# Patient Record
Sex: Female | Born: 1950 | Race: White | Hispanic: Yes | Marital: Single | Smoking: Never smoker
Health system: Southern US, Community
[De-identification: ages and names within clinical notes are randomized; demographics above are authoritative.]

## PROBLEM LIST (undated history)

## (undated) DIAGNOSIS — B9681 Helicobacter pylori [H. pylori] as the cause of diseases classified elsewhere: Secondary | ICD-10-CM

## (undated) DIAGNOSIS — K5730 Diverticulosis of large intestine without perforation or abscess without bleeding: Secondary | ICD-10-CM

## (undated) DIAGNOSIS — I10 Essential (primary) hypertension: Secondary | ICD-10-CM

## (undated) DIAGNOSIS — K2970 Gastritis, unspecified, without bleeding: Secondary | ICD-10-CM

## (undated) DIAGNOSIS — J45909 Unspecified asthma, uncomplicated: Secondary | ICD-10-CM

## (undated) DIAGNOSIS — K219 Gastro-esophageal reflux disease without esophagitis: Secondary | ICD-10-CM

## (undated) DIAGNOSIS — E785 Hyperlipidemia, unspecified: Secondary | ICD-10-CM

## (undated) DIAGNOSIS — Z86010 Personal history of colonic polyps: Principal | ICD-10-CM

## (undated) DIAGNOSIS — H409 Unspecified glaucoma: Secondary | ICD-10-CM

## (undated) DIAGNOSIS — T7840XA Allergy, unspecified, initial encounter: Secondary | ICD-10-CM

## (undated) DIAGNOSIS — E669 Obesity, unspecified: Secondary | ICD-10-CM

## (undated) HISTORY — DX: Gastritis, unspecified, without bleeding: K29.70

## (undated) HISTORY — DX: Hyperlipidemia, unspecified: E78.5

## (undated) HISTORY — DX: Unspecified glaucoma: H40.9

## (undated) HISTORY — PX: COLONOSCOPY: SHX174

## (undated) HISTORY — DX: Allergy, unspecified, initial encounter: T78.40XA

## (undated) HISTORY — DX: Diverticulosis of large intestine without perforation or abscess without bleeding: K57.30

## (undated) HISTORY — DX: Obesity, unspecified: E66.9

## (undated) HISTORY — DX: Essential (primary) hypertension: I10

## (undated) HISTORY — DX: Gastro-esophageal reflux disease without esophagitis: K21.9

## (undated) HISTORY — DX: Helicobacter pylori (H. pylori) as the cause of diseases classified elsewhere: B96.81

## (undated) HISTORY — DX: Unspecified asthma, uncomplicated: J45.909

## (undated) HISTORY — DX: Personal history of colonic polyps: Z86.010

## (undated) HISTORY — PX: CATARACT EXTRACTION: SUR2

## (undated) HISTORY — PX: POLYPECTOMY: SHX149

---

## 2003-12-29 ENCOUNTER — Emergency Department (HOSPITAL_COMMUNITY): Admission: EM | Admit: 2003-12-29 | Discharge: 2003-12-29 | Payer: Self-pay | Admitting: Emergency Medicine

## 2004-04-02 ENCOUNTER — Other Ambulatory Visit: Admission: RE | Admit: 2004-04-02 | Discharge: 2004-04-02 | Payer: Self-pay | Admitting: Obstetrics and Gynecology

## 2005-08-07 ENCOUNTER — Encounter (INDEPENDENT_AMBULATORY_CARE_PROVIDER_SITE_OTHER): Payer: Self-pay | Admitting: *Deleted

## 2005-08-07 ENCOUNTER — Ambulatory Visit: Payer: Self-pay | Admitting: Family Medicine

## 2005-11-04 ENCOUNTER — Ambulatory Visit (HOSPITAL_COMMUNITY): Admission: RE | Admit: 2005-11-04 | Discharge: 2005-11-04 | Payer: Self-pay | Admitting: *Deleted

## 2005-12-15 ENCOUNTER — Emergency Department (HOSPITAL_COMMUNITY): Admission: EM | Admit: 2005-12-15 | Discharge: 2005-12-15 | Payer: Self-pay | Admitting: Emergency Medicine

## 2005-12-16 ENCOUNTER — Ambulatory Visit: Payer: Self-pay | Admitting: Family Medicine

## 2005-12-25 ENCOUNTER — Ambulatory Visit (HOSPITAL_COMMUNITY): Admission: RE | Admit: 2005-12-25 | Discharge: 2005-12-25 | Payer: Self-pay | Admitting: Obstetrics & Gynecology

## 2006-01-14 ENCOUNTER — Ambulatory Visit: Payer: Self-pay | Admitting: Family Medicine

## 2006-02-10 ENCOUNTER — Ambulatory Visit: Payer: Self-pay | Admitting: Family Medicine

## 2006-02-17 ENCOUNTER — Ambulatory Visit: Payer: Self-pay | Admitting: Family Medicine

## 2006-03-04 ENCOUNTER — Ambulatory Visit: Payer: Self-pay | Admitting: Internal Medicine

## 2006-03-19 ENCOUNTER — Ambulatory Visit: Payer: Self-pay | Admitting: Family Medicine

## 2006-04-08 ENCOUNTER — Ambulatory Visit: Payer: Self-pay | Admitting: Internal Medicine

## 2006-07-22 ENCOUNTER — Encounter: Payer: Self-pay | Admitting: Obstetrics and Gynecology

## 2006-07-22 ENCOUNTER — Ambulatory Visit: Payer: Self-pay | Admitting: Obstetrics and Gynecology

## 2006-09-27 ENCOUNTER — Encounter (INDEPENDENT_AMBULATORY_CARE_PROVIDER_SITE_OTHER): Payer: Self-pay | Admitting: Specialist

## 2006-09-27 ENCOUNTER — Ambulatory Visit (HOSPITAL_COMMUNITY): Admission: RE | Admit: 2006-09-27 | Discharge: 2006-09-28 | Payer: Self-pay | Admitting: Cardiology

## 2006-09-27 ENCOUNTER — Ambulatory Visit: Payer: Self-pay | Admitting: Obstetrics and Gynecology

## 2006-09-27 HISTORY — PX: VAGINAL HYSTERECTOMY: SUR661

## 2006-10-20 ENCOUNTER — Ambulatory Visit: Payer: Self-pay | Admitting: Obstetrics & Gynecology

## 2006-11-18 ENCOUNTER — Ambulatory Visit: Payer: Self-pay | Admitting: Obstetrics and Gynecology

## 2006-11-18 ENCOUNTER — Ambulatory Visit (HOSPITAL_COMMUNITY): Admission: RE | Admit: 2006-11-18 | Discharge: 2006-11-18 | Payer: Self-pay | Admitting: Gynecology

## 2007-03-23 ENCOUNTER — Ambulatory Visit: Payer: Self-pay | Admitting: Family Medicine

## 2007-05-24 ENCOUNTER — Ambulatory Visit: Payer: Self-pay | Admitting: Family Medicine

## 2007-06-02 ENCOUNTER — Ambulatory Visit: Payer: Self-pay | Admitting: Family Medicine

## 2007-06-22 ENCOUNTER — Ambulatory Visit: Payer: Self-pay | Admitting: Gynecology

## 2007-11-18 ENCOUNTER — Ambulatory Visit (HOSPITAL_COMMUNITY): Admission: RE | Admit: 2007-11-18 | Discharge: 2007-11-18 | Payer: Self-pay | Admitting: Family Medicine

## 2007-12-30 ENCOUNTER — Ambulatory Visit: Payer: Self-pay | Admitting: Family Medicine

## 2008-04-12 ENCOUNTER — Ambulatory Visit: Payer: Self-pay | Admitting: Family Medicine

## 2008-04-23 ENCOUNTER — Ambulatory Visit: Payer: Self-pay | Admitting: Family Medicine

## 2008-05-28 ENCOUNTER — Ambulatory Visit: Payer: Self-pay | Admitting: Family Medicine

## 2008-07-30 ENCOUNTER — Ambulatory Visit: Payer: Self-pay | Admitting: Family Medicine

## 2008-12-13 ENCOUNTER — Encounter: Admission: RE | Admit: 2008-12-13 | Discharge: 2008-12-13 | Payer: Self-pay | Admitting: Family Medicine

## 2008-12-18 ENCOUNTER — Ambulatory Visit (HOSPITAL_COMMUNITY): Admission: RE | Admit: 2008-12-18 | Discharge: 2008-12-18 | Payer: Self-pay | Admitting: Family Medicine

## 2009-01-01 ENCOUNTER — Ambulatory Visit: Payer: Self-pay | Admitting: Family Medicine

## 2009-01-08 ENCOUNTER — Ambulatory Visit (HOSPITAL_COMMUNITY): Admission: RE | Admit: 2009-01-08 | Discharge: 2009-01-08 | Payer: Self-pay | Admitting: Family Medicine

## 2009-01-11 ENCOUNTER — Ambulatory Visit: Payer: Self-pay | Admitting: Family Medicine

## 2009-01-15 ENCOUNTER — Encounter: Payer: Self-pay | Admitting: *Deleted

## 2009-01-31 ENCOUNTER — Encounter: Payer: Self-pay | Admitting: *Deleted

## 2009-02-13 ENCOUNTER — Ambulatory Visit: Payer: Self-pay | Admitting: Family Medicine

## 2009-04-18 ENCOUNTER — Ambulatory Visit: Payer: Self-pay | Admitting: Family Medicine

## 2009-05-22 ENCOUNTER — Ambulatory Visit: Payer: Self-pay | Admitting: Family Medicine

## 2009-05-27 ENCOUNTER — Emergency Department (HOSPITAL_COMMUNITY): Admission: EM | Admit: 2009-05-27 | Discharge: 2009-05-27 | Payer: Self-pay | Admitting: Emergency Medicine

## 2009-05-28 ENCOUNTER — Ambulatory Visit: Payer: Self-pay | Admitting: Family Medicine

## 2009-06-07 ENCOUNTER — Ambulatory Visit: Payer: Self-pay | Admitting: Family Medicine

## 2009-06-14 ENCOUNTER — Ambulatory Visit: Payer: Self-pay | Admitting: Family Medicine

## 2009-09-12 ENCOUNTER — Ambulatory Visit: Payer: Self-pay | Admitting: Family Medicine

## 2009-09-13 ENCOUNTER — Ambulatory Visit: Payer: Self-pay | Admitting: Family Medicine

## 2009-09-13 ENCOUNTER — Encounter: Admission: RE | Admit: 2009-09-13 | Discharge: 2009-09-13 | Payer: Self-pay | Admitting: Family Medicine

## 2009-10-24 ENCOUNTER — Ambulatory Visit: Payer: Self-pay | Admitting: Family Medicine

## 2009-12-20 ENCOUNTER — Ambulatory Visit (HOSPITAL_COMMUNITY): Admission: RE | Admit: 2009-12-20 | Discharge: 2009-12-20 | Payer: Self-pay | Admitting: Family Medicine

## 2010-01-01 ENCOUNTER — Ambulatory Visit: Payer: Self-pay | Admitting: Obstetrics & Gynecology

## 2010-01-06 ENCOUNTER — Ambulatory Visit: Payer: Self-pay | Admitting: Family Medicine

## 2010-04-10 ENCOUNTER — Ambulatory Visit: Payer: Self-pay | Admitting: Family Medicine

## 2010-07-24 ENCOUNTER — Ambulatory Visit: Payer: Self-pay | Admitting: Family Medicine

## 2010-09-27 ENCOUNTER — Encounter: Payer: Self-pay | Admitting: *Deleted

## 2010-09-28 ENCOUNTER — Encounter: Payer: Self-pay | Admitting: Family Medicine
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
## 2010-11-26 ENCOUNTER — Other Ambulatory Visit: Payer: Self-pay | Admitting: Family Medicine

## 2010-11-26 DIAGNOSIS — Z1231 Encounter for screening mammogram for malignant neoplasm of breast: Secondary | ICD-10-CM

## 2010-12-22 ENCOUNTER — Ambulatory Visit (HOSPITAL_COMMUNITY)
Admission: RE | Admit: 2010-12-22 | Discharge: 2010-12-22 | Disposition: A | Discharge status: Home / Self Care | Payer: Commercial Managed Care - PPO | Source: Ambulatory Visit | Attending: Family Medicine | Admitting: Family Medicine

## 2010-12-22 DIAGNOSIS — Z1231 Encounter for screening mammogram for malignant neoplasm of breast: Secondary | ICD-10-CM

## 2010-12-25 ENCOUNTER — Encounter (INDEPENDENT_AMBULATORY_CARE_PROVIDER_SITE_OTHER): Payer: Commercial Managed Care - PPO | Admitting: Family Medicine

## 2010-12-25 DIAGNOSIS — E119 Type 2 diabetes mellitus without complications: Secondary | ICD-10-CM

## 2010-12-25 DIAGNOSIS — R110 Nausea: Secondary | ICD-10-CM

## 2010-12-25 DIAGNOSIS — I10 Essential (primary) hypertension: Secondary | ICD-10-CM

## 2010-12-25 DIAGNOSIS — E785 Hyperlipidemia, unspecified: Secondary | ICD-10-CM

## 2010-12-25 DIAGNOSIS — Z79899 Other long term (current) drug therapy: Secondary | ICD-10-CM

## 2011-01-08 ENCOUNTER — Ambulatory Visit (INDEPENDENT_AMBULATORY_CARE_PROVIDER_SITE_OTHER): Payer: Commercial Managed Care - PPO | Admitting: Family Medicine

## 2011-01-08 DIAGNOSIS — M549 Dorsalgia, unspecified: Secondary | ICD-10-CM

## 2011-01-23 NOTE — Group Therapy Note (Signed)
[name], [name] NO.:  [id number]   MEDICAL RECORD NO.:  [id number]          PATIENT TYPE:  WOC   LOCATION:  [facility]                   FACILITY:  [facility]   PHYSICIAN:  Argentina Donovan, MD        DATE OF BIRTH:  01-23-1951   DATE OF SERVICE:  07/22/2006                                    CLINIC NOTE   The patient is a 60 year old gravida 4, para 4-0-0-4 all vaginal deliveries,  the youngest child at 77 years old.  For the last 10 years has had pelvic  pain on and off, mainly with her period and significant enough that she  wants something done.  Ultrasound showed a uterus that was slightly enlarged  probably adenomyosis with a normal endometrial stripe.  Her periods have  gradually gotten heavier, and she is not menopausal as yet but wants  something done.  We have discussed the procedure with her and talked about  vaginal hysterectomy with possibility of abdominal, and she agrees to go  through with the surgery.  She is 5 feet five inches, weighs 179 pounds, has  a blood pressure of 133/90 and is on some blood pressure medication which  she did not know the name of today.  Other than that, she is in good  physical health with no other evidence of comorbidity.  The Pap smear she  gets annually, and they have always been normal.  Her abdomen is soft, flat,  nontender in the area where she states this pain even on deep palpation  without guarding or rebound.  External genitalia is normal.  BUN is within  normal limits.  Vagina is clean and well rugated.  Cervix clean and parous.  A Pap smear was taken.  The uterus is about the size of an 8 week pregnancy,  soft in configuration, anterior, and free, movable with a normal free cul-de-  sac.  The adnexa could not be well outlined because of the habitus of the  patient and the size of the uterus.  __________  negative no edema, no  varices.  Deep tendon reflexes were normal.   IMPRESSION:  Chronic pelvic pain with  menorrhagia, probably adenomyosis.  We  will schedule the patient for vaginal hysterectomy.           ______________________________  Argentina Donovan, MD     PR/MEDQ  D:  07/22/2006  T:  07/22/2006  Job:  [id number]

## 2011-01-23 NOTE — Group Therapy Note (Signed)
[name], [name]                  ACCOUNT NO.:  [id number]   MEDICAL RECORD NO.:  [id number]          PATIENT TYPE:  WOC   LOCATION:  [facility]                   FACILITY:  [facility]   PHYSICIAN:  Tinnie Gens, MD        DATE OF BIRTH:  13-Nov-1950   DATE OF SERVICE:                                    CLINIC NOTE   CHIEF COMPLAINT:  A four week follow up.   HISTORY OF PRESENT ILLNESS:  The patient is a 60 year old Hispanic female  who recently had a regular cycle but came in after heavy period in February.  At that time, an attempt was made to do endometrial sampling.  However, it  could not be done because of a very scarred cervix.  She did not have any  bleeds since that time.  She was sent for ultrasound which showed a uterus  that was enlarged probably with adenomyosis and a normal endometrial stripe  over 25 mm.  On exam, her labs were in the chart.   PHYSICAL EXAMINATION:  GENERAL APPEARANCE:  Well-developed, well-nourished,  sitting up in no acute distress.  ABDOMEN:  Soft, nontender, nondistended.   IMPRESSION:  Abnormal uterine bleeding x1, likely perimenopausal, unlikely  the risk of endometrial carcinoma secondary to __________endometrial stripe.  However, should heavy bleeding begin again, would try to do sampling.  Have  advised patient to come in with her next bleeding episode, otherwise, she  will follow up in December for repeat Pap smear.           ______________________________  Tinnie Gens, MD     TP/MEDQ  D:  01/14/2006  T:  01/15/2006  Job:  [id number]

## 2011-01-23 NOTE — Group Therapy Note (Signed)
[name], [name]                  ACCOUNT NO.:  [id number]   MEDICAL RECORD NO.:  [id number]          PATIENT TYPE:  WOC   LOCATION:  [facility]                   FACILITY:  [facility]   PHYSICIAN:  Tinnie Gens, MD        DATE OF BIRTH:  1950-09-13   DATE OF SERVICE:  08/07/2005                                    CLINIC NOTE   CHIEF COMPLAINT:  Annual examination.   HISTORY OF PRESENT ILLNESS:  Patient is a 60 year old gravida 4, para 2 who  comes in today for an annual examination.  It has been approximately 18  months since her last Pap smear and mammography.  Today she is without  significant complaint.   PAST MEDICAL HISTORY:  She does have a history of elevated blood pressure  approximately three years ago but she lost weight and said it was better  after that.   PAST SURGICAL HISTORY:  Negative.   MEDICATIONS:  None.   ALLERGIES:  None known.   Cycles have begun to slow down.  She is having one approximately every two  to three months.  She does have some hot flashes occasionally.  She does  have a history of abnormal Pap smear.  She is status post cryo in 1996.  She  had a normal mammogram in 2005.   FAMILY HISTORY:  Diabetes and high blood pressure in her mother.   SOCIAL HISTORY:  No tobacco, alcohol, or drug use.   14-point review of systems is reviewed.  Please see GYN history in the  chart, but is essentially negative.   PHYSICAL EXAMINATION:  VITAL SIGNS:  Blood pressure 152/99, weight is 172.9.  GENERAL:  She is a well-developed, well-nourished Hispanic female in no  acute distress.  NECK:  Supple with normal thyroid.  BREASTS:  Symmetric with everted nipples.  She has some fibrocystic change  at approximately 6 o'clock bilaterally, but no discrete masses.  LYMPH:  There is no supraclavicular or axillary adenopathy.  ABDOMEN:  Soft and flat.  There is no organomegaly.  EXTREMITIES:  No clubbing, cyanosis, edema.  Pulses are 2+ bilaterally.  GENITOURINARY:  She  has some atrophic external and vaginal changes noted.  The vagina is pale and has lost rugation.  Cervix has a scar noted from old  cryo, but no lesions.  The transition zone is well up in the endocervical  canal.  The uterus is approximately 6-8 weeks size and firm and globular.  There is no adnexal mass and she is mildly tender in the right adnexa.   IMPRESSION:  GYN examination.   PLAN:  1.  Pap smear today.  2.  Schedule mammography.  3.  Patient is given the number for [facility] for repeat blood pressure      check and encouraged to follow up on this.           ______________________________  Tinnie Gens, MD     TP/MEDQ  D:  08/07/2005  T:  08/07/2005  Job:  [id number]

## 2011-01-23 NOTE — H&P (Signed)
[name], [name]                  ACCOUNT NO.:  [id number]   MEDICAL RECORD NO.:  [id number]          PATIENT TYPE:  AMB   LOCATION:  [facility]                           [facility]   PHYSICIAN:  Phil D. Okey Dupre, M.D.     DATE OF BIRTH:  01/11/1951   DATE OF ADMISSION:  09/27/2006  DATE OF DISCHARGE:                              HISTORY & PHYSICAL   CHIEF COMPLAINT:  Heavy periods with severe pain.   HISTORY OF PRESENT ILLNESS:  The patient is a 60 year old gravida 4,  para 4-0-0-4, all vaginal deliveries, the youngest child age 36.  For  the last 10 years has had pelvic pain on and off and with her periods  significant enough that wants something done.  Ultrasound has shown the  uterus that is slightly enlarged, probably adenomyosis, with a normal  endometrial stripe.  An endometrial biopsy was not possible because of  the stenosis of the cervix, which was tried back in 2006.  Her periods  have gradually gotten heavier and the pain has gotten worse.  We have  discussed the procedure with having the patient go through a vaginal  hysterectomy with the possibility of abdominal hysterectomy, and she  desires this surgery.  We have talked to her and told her that probably  if we waited a short time, all her periods would stop and, if that was  the case, the pain, if it is due to adenomyosis, would go away also.  She does not want to wait.  She says she has suffered long enough.  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   She is generally in good health with the exception of blood pressure  problems, for which she takes blood pressure medication daily.  She is  going to bring in the bottle so we know what she is taking, but she did  not know the name of it when we talked to her today.  She has a Pap  smear annually.  They have always been normal.  She also complains of  dyspareunia on deep penetration.  We had a long discussion.  I told  the  patient that if she would wait, there were certainly not going to be any  surgical complications but that there were possible complications with  the surgery that entailed those that could mean anesthetic complications  and even death.  We talked about injury to bowel, urinary tract,  hemorrhage, postoperative infection, and she still at the end of our  discussion said she wanted to undergo the surgery.   PAST MEDICAL HISTORY:  She has a history of elevated blood pressure and  is still on medication.   PAST SURGICAL HISTORY:  None.   MEDICATIONS:  She takes blood pressure medication and she takes Tylenol  for her pain.   ALLERGIES:  She has no known medical allergies.   FAMILY HISTORY:  Her mother has diabetes and high blood pressure.   SOCIAL HISTORY:  No tobacco, alcohol or  drug use.   A 14-point review was generally negative with the exception of the  present illness.   PHYSICAL EXAMINATION:  VITAL SIGNS:  The patient is 5 feet 5 inches  tall, weighs 179 pounds.  The blood pressure is 133/90.  Her pulse is  68.  Respirations 18 per minute.  Temperature is 98.9.  GENERAL:  She is a well-developed, well-nourished Hispanic female who  speaks English, in no acute distress.  NECK:  Supple.  Thyroid is normal.  HEENT:  PERRLA, within normal limits.  BACK:  Erect.  BREASTS:  Symmetrical with no nipple discharge and no dominant masses.  ABDOMEN:  Soft, flat, nontender, no masses, no organomegaly palpable.  EXTREMITIES:  No edema.  No varices.  DTRs within normal limits.  GENITOURINARY:  The external genitalia is normal.  BUS within normal  limits with a slight area of apparent atrophy.  The vagina is clean and  well-rugated.  The cervix is clean, parous and stenotic.  The uterus is  about 8-10 weeks' gestational size, freely movable.  The adnexa could  not be palpated.  RECTAL:  There is no dominant mass.   IMPRESSION:  Menorrhagia with severe dysmenorrhea in a  perimenopausal  woman.   PLAN:  Total abdominal hysterectomy, possible bilateral salpingo-  oophorectomy, possible abdominal hysterectomy.           ______________________________  Javier Glazier Okey Dupre, M.D.     PDR/MEDQ  D:  09/21/2006  T:  09/21/2006  Job:  [id number]

## 2011-01-23 NOTE — Op Note (Signed)
[name], [name]                  ACCOUNT NO.:  [id number]   MEDICAL RECORD NO.:  [id number]          PATIENT TYPE:  AMB   LOCATION:  [facility]                           [facility]   PHYSICIAN:  Tanya S. Shawnie Pons, M.D.   DATE OF BIRTH:  [birth year]   DATE OF PROCEDURE:  09/27/2006  DATE OF DISCHARGE:                               OPERATIVE REPORT   PREOPERATIVE DIAGNOSES:  Dysmenorrhea, menorrhagia.   POSTOPERATIVE DIAGNOSES:  Dysmenorrhea, menorrhagia, left paraovarian  cyst.   PROCEDURE:  Transvaginal hysterectomy and left paraovarian cystectomy.   SURGEON:  Javier Glazier. Okey Dupre, M.D.   ASSISTANT:  Shelbie Proctor. Shawnie Pons, M.D.   ANESTHESIA:  General and local.   SPECIMENS:  213 gram uterus and __________ .   ESTIMATED BLOOD LOSS:  300 mL.   COMPLICATIONS:  None.   INDICATIONS FOR PROCEDURE:  Briefly, the patient is a 60 year old  gravida 4, para 4 who has severe dysmenorrhea as well as menorrhagia.  She had attempt at endometrial biopsy that could not be done secondary  to the cervical stenosis but had a 5 mm endometrial stripe. She had an  enlarged uterus on ultrasound which suggested the presence of  adenomyosis. The patient desired permanent treatment for this and was  canceled regarding the risks and benefits of this procedure. She agreed  to the treatment offered.   DESCRIPTION OF PROCEDURE:  The patient went to the OR, she was placed in  dorsal lithotomy in [place] stirrups. She was prepped and draped in the  usual sterile fashion. A weighted speculum was placed above the vagina.  The cervix was visualized, grasped with two double-tooth tenaculums and  10 mL of 1% lidocaine with epinephrine was injected circumferentially. A  circumferential incision was made with a knife, this brought entry into  the peritoneal cavity posteriorly. The posterior peritoneum was tacked  to the edge of the vagina. Attempt was made to __________  anteriorly;  however, this could not be done so 2 Haney  clamps were then used to  grasp the uterosacral ligaments which were cut and suture ligatured with  Heaney type suture and tagged. The gyrus was then used to take bits of  the uterus to include the uterines and the broad ligament until the  uterus could be brought out through the incision with __________  pedicles. __________  then inspected, grasped with Heaney clamp and  specimen removed. Free ties plus suture ligature were used about each of  these pedicles and excellent hemostasis was noted. Some bleeding from  the posterior cuff was then noted but the pedicles appeared dry. A large  __________  approximately 3 cm was noted on the left and this was  removed without difficulty with the gyrus. The vaginal cuff was then  closed horizontally incorporating the uterosacral ligament into the cuff  at the lateral edges. All suture and instruments were removed from the  vagina. The Foley catheter was placed inside the bladder, clear yellow  urine was  noted. The vagina was packed with iodoform gauze. All instrument and lap  counts were correct x2.  The patient was awakened and taken to the  recovery room in stable condition. The specimens in this case are stent  to pathology. The patient went to the recovery room in stable condition.           ______________________________  Shelbie Proctor Shawnie Pons, M.D.     TSP/MEDQ  D:  09/27/2006  T:  09/27/2006  Job:  [id number]

## 2011-01-23 NOTE — Discharge Summary (Signed)
NAMECALISTA, [name]                  ACCOUNT NO.:  [id number]   MEDICAL RECORD NO.:  [id number]          PATIENT TYPE:  OIB   LOCATION:  9319                          [facility]   PHYSICIAN:  Phil D. Okey Dupre, M.D.     DATE OF BIRTH:  [birth year]   DATE OF ADMISSION:  09/27/2006  DATE OF DISCHARGE:  09/28/2006                               DISCHARGE SUMMARY   The patient is a 60 year old black female with chronic menorrhagia and  dysmenorrhea who insisted on surgery although close to menopause.  Underwent total vaginal hysterectomy on the day of admission. Has done  very well overnight. Has had vital signs normal, afebrile course,  extremities normal.   PHYSICAL EXAMINATION AT DISCHARGE:  Her lungs are clear.  NECK:  Is supple.  ABDOMEN:  Soft, flat, not particularly tender. No rebound. No  significant genital discharge.  EXTREMITIES:  Negative.   The patient is voiding well. Will be followed in the office in 2 weeks.  We discussed postoperative activity with her, especially that related to  lifting and stairs. We have told her that the time for recovery is 6  weeks; however, with her job not gauged at heavy lifting, she might be  able to go back in 3 or 4. Discharge hemoglobin was 10.9; admission was  12.2. None of the rest of the CBC had changed significantly since  admission.   DISCHARGE DIAGNOSES:  Status post vaginal hysterectomy for menorrhagia  and dysmenorrhea.           ______________________________  Javier Glazier. Okey Dupre, M.D.     PDR/MEDQ  D:  09/28/2006  T:  09/28/2006  Job:  [id number]

## 2011-01-23 NOTE — Group Therapy Note (Signed)
NAMELYGIA, OLAES                  ACCOUNT NO.:  [id number]   MEDICAL RECORD NO.:  [id number]          PATIENT TYPE:  WOC   [facility]ATION[facility]                   [facility]   PHYSICIAN:  Tinnie Gens, MD        DATE OF BIRTH:  [birth year]   DATE OF SERVICE:  12/16/2005                                    CLINIC NOTE   CHIEF COMPLAINT:  Heavy vaginal bleeding.   HISTORY OF PRESENT ILLNESS:  The patient is a 60 year old Hispanic lady, who  was recently seen here in December of 2006, without complaints, who started  to have irregular cycles at that time, and noted that her last menstrual  period was exceptionally heavy and lasted longer than usual.  She had almost  no period in March.  As previously noted, she has a history of an abnormal  Pap smear and had cryo in 1996.  The patient also complains of some  allergies.  She was seen in the ED for this last p.m. and has gotten  treatment.   PHYSICAL EXAMINATION:  VITAL SIGNS:  As in the chart.  GENERAL:  A well-  developed, well-nourished, Hispanic female, in no acute distress.  ABDOMEN:  Soft, nontender, nondistended.  GU:  She has a small atrophic vagina, pale  in nature.  Cervix is scarred.  I can not find an easy entrance for the os.  An attempt was made for endometrial biopsy, but this could not be obtained  today.  As mentioned previously, the cervix was scarred, related to prior  surgery and no os could be visualized.  The uterus is lobular and  approximately 6-8 weeks size, although good definition was very hard to  discern today.  The patient has right lower quadrant tenderness, but it was  fairly significant and she had guarding __________ , and a good exam was not  able to obtained on that side either.   IMPRESSION:  Right-sided abdominal pain and abnormal uterine bleeding in a  perimenopausal female.   PLAN:  1.  Return with menses for endometrial biopsy sampling.  2.  Pelvic ultrasound, rule out pathology.  Follow up  in four weeks.           ______________________________  Tinnie Gens, MD     TP/MEDQ  D:  12/16/2005  T:  12/17/2005  Job:  [id number]

## 2011-02-25 ENCOUNTER — Encounter: Payer: Self-pay | Admitting: Family Medicine

## 2011-02-25 DIAGNOSIS — E119 Type 2 diabetes mellitus without complications: Secondary | ICD-10-CM | POA: Insufficient documentation

## 2011-02-25 DIAGNOSIS — J309 Allergic rhinitis, unspecified: Secondary | ICD-10-CM | POA: Insufficient documentation

## 2011-02-25 DIAGNOSIS — K219 Gastro-esophageal reflux disease without esophagitis: Secondary | ICD-10-CM | POA: Insufficient documentation

## 2011-02-25 DIAGNOSIS — K5790 Diverticulosis of intestine, part unspecified, without perforation or abscess without bleeding: Secondary | ICD-10-CM | POA: Insufficient documentation

## 2011-02-25 DIAGNOSIS — E669 Obesity, unspecified: Secondary | ICD-10-CM | POA: Insufficient documentation

## 2011-02-25 DIAGNOSIS — I152 Hypertension secondary to endocrine disorders: Secondary | ICD-10-CM | POA: Insufficient documentation

## 2011-02-25 DIAGNOSIS — I10 Essential (primary) hypertension: Secondary | ICD-10-CM

## 2011-02-25 DIAGNOSIS — E1136 Type 2 diabetes mellitus with diabetic cataract: Secondary | ICD-10-CM | POA: Insufficient documentation

## 2011-02-25 DIAGNOSIS — E118 Type 2 diabetes mellitus with unspecified complications: Secondary | ICD-10-CM | POA: Insufficient documentation

## 2011-02-25 DIAGNOSIS — E1165 Type 2 diabetes mellitus with hyperglycemia: Secondary | ICD-10-CM | POA: Insufficient documentation

## 2011-02-25 DIAGNOSIS — E1159 Type 2 diabetes mellitus with other circulatory complications: Secondary | ICD-10-CM | POA: Insufficient documentation

## 2011-03-27 ENCOUNTER — Encounter: Payer: Self-pay | Admitting: Family Medicine

## 2011-03-27 ENCOUNTER — Ambulatory Visit: Payer: Commercial Managed Care - PPO | Admitting: Family Medicine

## 2011-03-30 ENCOUNTER — Ambulatory Visit: Payer: Commercial Managed Care - PPO | Admitting: Family Medicine

## 2011-03-31 ENCOUNTER — Telehealth: Payer: Self-pay

## 2011-03-31 ENCOUNTER — Other Ambulatory Visit: Payer: Self-pay | Admitting: Family Medicine

## 2011-03-31 ENCOUNTER — Encounter: Payer: Self-pay | Admitting: Family Medicine

## 2011-03-31 ENCOUNTER — Ambulatory Visit (INDEPENDENT_AMBULATORY_CARE_PROVIDER_SITE_OTHER): Payer: Commercial Managed Care - PPO | Admitting: Family Medicine

## 2011-03-31 DIAGNOSIS — I10 Essential (primary) hypertension: Secondary | ICD-10-CM

## 2011-03-31 DIAGNOSIS — E119 Type 2 diabetes mellitus without complications: Secondary | ICD-10-CM

## 2011-03-31 DIAGNOSIS — E785 Hyperlipidemia, unspecified: Secondary | ICD-10-CM

## 2011-03-31 DIAGNOSIS — Z79899 Other long term (current) drug therapy: Secondary | ICD-10-CM

## 2011-03-31 DIAGNOSIS — E1169 Type 2 diabetes mellitus with other specified complication: Secondary | ICD-10-CM

## 2011-03-31 DIAGNOSIS — R932 Abnormal findings on diagnostic imaging of liver and biliary tract: Secondary | ICD-10-CM

## 2011-03-31 DIAGNOSIS — E669 Obesity, unspecified: Secondary | ICD-10-CM

## 2011-03-31 DIAGNOSIS — I152 Hypertension secondary to endocrine disorders: Secondary | ICD-10-CM

## 2011-03-31 DIAGNOSIS — R1011 Right upper quadrant pain: Secondary | ICD-10-CM

## 2011-03-31 LAB — CBC WITH DIFFERENTIAL/PLATELET
Basophils Absolute: 0 10*3/uL (ref 0.0–0.1)
Basophils Relative: 1 % (ref 0–1)
HCT: 38.1 % (ref 36.0–46.0)
Monocytes Relative: 8 % (ref 3–12)
Neutro Abs: 2 10*3/uL (ref 1.7–7.7)
Platelets: 290 10*3/uL (ref 150–400)
RBC: 4.41 MIL/uL (ref 3.87–5.11)
RDW: 15.4 % (ref 11.5–15.5)

## 2011-03-31 LAB — COMPREHENSIVE METABOLIC PANEL
AST: 14 U/L (ref 0–37)
BUN: 13 mg/dL (ref 6–23)
CO2: 28 mEq/L (ref 19–32)
Calcium: 9.2 mg/dL (ref 8.4–10.5)
Creat: 0.6 mg/dL (ref 0.50–1.10)
Potassium: 4.3 mEq/L (ref 3.5–5.3)
Sodium: 139 mEq/L (ref 135–145)

## 2011-03-31 LAB — LIPID PANEL
Total CHOL/HDL Ratio: 3.3 Ratio
Triglycerides: 80 mg/dL (ref <150)
VLDL: 16 mg/dL (ref 0–40)

## 2011-03-31 NOTE — Telephone Encounter (Signed)
Left message for pt on both apt 1st is [place] [facility] Thursday 26 @ 8 am nothing to eat or drink after mid nite and 2nd [facility] aug. 7 @ 9:30

## 2011-03-31 NOTE — Patient Instructions (Signed)
Continue on your present medications. Use Lotrimin for the rash under right breast and if no improvement after one month, call me

## 2011-03-31 NOTE — Progress Notes (Signed)
  Subjective:    Patient ID: [name], female    DOB: 1951-05-09, 60 y.o.   MRN: [id number]  HPI She is here for recheck. She does complain of right upper cautery the pain. Her command of the [place] language is limited. She does state that the milk makes the pain worse. Further questioning was difficult. She also complains of a lesion under her right breast. She also has had difficulty with bilateral leg pain when she stands for long period of time. She continues on medications listed in the chart. She does not smoke or drink. She has not had an eye exam recently.   Review of Systems     Objective:   Physical Exam Alert and in no distress. Cardiac exam shows regular rhythm without murmurs or gallops. Lungs are clear to auscultation. Abdominal exam shows active bowel sounds with positive Murphy's sign in Murphy's punch. Extremity exam shows superficial varicosities. Pulses and reflexes are normal. Skin is normal. Hemoglobin A1c 7.1       Assessment & Plan:  Right upper quadrant pain, possible gallbladder disease.  Diabetes. Hypertension. Hyperlipidemia. Obesity. A. Dermatitis possibly fungal on her right breast. Routine blood screening and gallbladder ultrasound as well as referral to ophthalmology.

## 2011-04-02 ENCOUNTER — Other Ambulatory Visit: Payer: Commercial Managed Care - PPO

## 2011-04-02 NOTE — Progress Notes (Signed)
Mailed copy and diet to pt

## 2011-04-06 NOTE — Progress Notes (Signed)
Addended by: Lavell Islam on: 04/06/2011 12:31 PM   Modules accepted: Orders

## 2011-04-24 ENCOUNTER — Ambulatory Visit
Admission: RE | Admit: 2011-04-24 | Discharge: 2011-04-24 | Disposition: A | Discharge status: Home / Self Care | Payer: Commercial Managed Care - PPO | Source: Ambulatory Visit | Attending: Medical | Admitting: Medical

## 2011-04-24 ENCOUNTER — Ambulatory Visit (INDEPENDENT_AMBULATORY_CARE_PROVIDER_SITE_OTHER): Payer: Commercial Managed Care - PPO | Admitting: Medical

## 2011-04-24 ENCOUNTER — Encounter: Payer: Self-pay | Admitting: Medical

## 2011-04-24 DIAGNOSIS — K5732 Diverticulitis of large intestine without perforation or abscess without bleeding: Secondary | ICD-10-CM

## 2011-04-24 DIAGNOSIS — R109 Unspecified abdominal pain: Secondary | ICD-10-CM

## 2011-04-24 DIAGNOSIS — K5792 Diverticulitis of intestine, part unspecified, without perforation or abscess without bleeding: Secondary | ICD-10-CM

## 2011-04-24 DIAGNOSIS — E119 Type 2 diabetes mellitus without complications: Secondary | ICD-10-CM

## 2011-04-24 DIAGNOSIS — I10 Essential (primary) hypertension: Secondary | ICD-10-CM

## 2011-04-24 LAB — CBC WITH DIFFERENTIAL/PLATELET
Basophils Absolute: 0 10*3/uL (ref 0.0–0.1)
Eosinophils Absolute: 0.1 10*3/uL (ref 0.0–0.7)
HCT: 38.4 % (ref 36.0–46.0)
Hemoglobin: 12.9 g/dL (ref 12.0–15.0)
MCH: 28.9 pg (ref 26.0–34.0)
MCV: 85.9 fL (ref 78.0–100.0)
Monocytes Absolute: 0.9 10*3/uL (ref 0.1–1.0)
Neutro Abs: 6.6 10*3/uL (ref 1.7–7.7)
Neutrophils Relative %: 63 % (ref 43–77)
RBC: 4.47 MIL/uL (ref 3.87–5.11)
WBC: 10.5 10*3/uL (ref 4.0–10.5)

## 2011-04-24 LAB — COMPREHENSIVE METABOLIC PANEL
Albumin: 4.2 g/dL (ref 3.5–5.2)
Alkaline Phosphatase: 80 U/L (ref 39–117)
Calcium: 9.3 mg/dL (ref 8.4–10.5)
Chloride: 101 mEq/L (ref 96–112)
Potassium: 4.2 mEq/L (ref 3.5–5.3)
Sodium: 136 mEq/L (ref 135–145)
Total Bilirubin: 1.3 mg/dL — ABNORMAL HIGH (ref 0.3–1.2)
Total Protein: 7.1 g/dL (ref 6.0–8.3)

## 2011-04-24 LAB — POCT URINALYSIS DIPSTICK
Bilirubin, UA: NEGATIVE
Nitrite, UA: NEGATIVE
Spec Grav, UA: 1.015
pH, UA: 5

## 2011-04-24 MED ORDER — HYDROCODONE-ACETAMINOPHEN 5-500 MG PO TABS: 1.0000 | ORAL_TABLET | Freq: Four times a day (QID) | ORAL | Status: AC | PRN

## 2011-04-24 MED ORDER — METRONIDAZOLE 500 MG PO TABS: 500.0000 mg | ORAL_TABLET | Freq: Three times a day (TID) | ORAL | Status: AC

## 2011-04-24 MED ORDER — CIPROFLOXACIN HCL 500 MG PO TABS: 500.0000 mg | ORAL_TABLET | Freq: Two times a day (BID) | ORAL | Status: AC

## 2011-04-24 NOTE — Progress Notes (Signed)
Subjective:   HPI  [name] is a 60 y.o. female who presents for 3 day history of abdominal pain.  She notes her last 3 days started having pain on her left side, think it was related to some spicy food she ate.  She denies similar prior pain in the past.  She does have a history of high blood pressure, diabetes, and has been out of some of her medication in the last 2 weeks.  She denies drinking alcohol. Denies history of high triglycerides or pancreatitis. Denies history of diverticulitis.  She did have a colonoscopy within the last 2 years as far she knows was normal.  She denies any urinary or vaginal complaints today. No back pain.  The pain was worse last 2 days but is slightly better today.  No other aggravating or relieving factors.  No other c/o.  The following portions of the patient's history were reviewed and updated as appropriate: allergies, current medications, past family history, past medical history, past social history, past surgical history and problem list.  Past Medical History  Diagnosis Date  . Hypertension   . Diabetes mellitus   . GERD (gastroesophageal reflux disease)     Review of Systems Constitutional: denies fever, chills, sweats, unexpected weight change, anorexia, fatigue Dermatology: denies rash ENT: no runny nose, ear pain, sore throat, hoarseness, sinus pain Cardiology: denies chest pain, palpitations, edema Respiratory: denies cough, shortness of breath, wheezing Gastroenterology: denies nausea, vomiting, diarrhea, constipation Musculoskeletal: denies arthralgias, myalgias, back pain Urology: denies dysuria, difficulty urinating, hematuria, urinary frequency, urgency     Objective:   Physical Exam  General appearance: alert, no distress, WD/WN, female, overweight Skin: No rash, erythema, ecchymosis Oral cavity: MMM, no lesions Neck: supple, no lymphadenopathy, no thyromegaly, no masses Heart: RRR, normal S1, S2, no murmurs Lungs: CTA  bilaterally, no wheezes, rhonchi, or rales Abdomen: +bs, soft, moderate tenderness of the left side and epigastric area, but particularly tender left lower quadrant, otherwise non distended, no masses, no hepatomegaly, no splenomegaly Back: non tender Extremities: no edema, no cyanosis, no clubbing Pulses: 2+ symmetric Rectal: Nontender, no blood, few small external hemorrhoids and adjacent skin tags.  Exam chaperoned by nurse  Assessment :    Encounter Diagnoses  Name Primary?  . Abdominal pain Yes  . Diverticulitis   . Type II or unspecified type diabetes mellitus without mention of complication, not stated as uncontrolled   . Essential hypertension, benign      Plan:    Discussed possible etiologies including diverticulitis, renal stone, pancreatitis, or other.  Looking back she had a colonoscopy in 2007 and had pan-diverticulosis at that time without diverticulitis.  I suspect she has diverticulitis today, and we will treat empirically for that with Cipro and Flagyl.  I gave her prescription for pain medicine today, advise liquid only diet today and possibly tomorrow.  If pain subsides significantly can slowly advance diet to [employer].  We will send for stat labs today, upright KUB x-ray.  Urine in house showed a little bacteria so we will send for culture as well.  Gave spanish language handout on diverticulitis at her request, discussed symptoms that would prompt ED department visit, otherwise recheck here on Monday.

## 2011-04-24 NOTE — Patient Instructions (Signed)
Diverticulitis Un divertculo es una pequea bolsa que aparece en el colon (intestino grueso). Diverticulosis, la presencia de divertculos en el colon, es una enfermedad benigna frecuente. Diverticulitis es la inflamacin (irritacin) o infeccin de los divertculos. Divertculo es una pequea bolsita que aparece en el colon (intestino grueso). La inflamacin puede provenir de una obstruccin por partculas de alimento y grmenes (bacterias) en esa pequea bolsa.  CAUSAS Ciertos grmenes (bacterias) colonizan el colon y los divertculos. Si algunas partculas de alimento obstruyen la pequea abertura de un divertculo, las bacterias que existen en su interior se multiplican y causan un aumento de la presin. Esta es la causa de la infeccin y la inflamacin.  SNTOMAS  Dolor y Associate Professor en el vientre (abdomen). Por lo general el dolor se ubica en la zona inferior izquierda del abdomen. Sin embargo, podra Sales promotion account executive.   Grant Ruts.   Hinchazn.   Ganas de vomitar (nuseas).   Vmitos.   Heces anormales.  DIAGNSTICO La historia clnica y el examen fsico ayudarn a diagnosticar la enfermedad. Ser necesario realizar otras pruebas ya que muchas cosas pueden causar dolor abdominal. Estas pruebas son:  Arna Snipe.  Pruebas de [place].   Radiografas de abdomen.  Tomografas computarizadas de abdomen.   En algunos casos se indicar ciruga para determinar si los sntomas se deben a diverticulitis o a otros trastornos. TRATAMIENTO En la mayor parte de los casos se trata sin [place]. El tratamiento incluye:  Reposo del intestino restringiendo la alimentacin slo a lquidos durante [employer].   Si est deshidratado le administrarn lquidos por va endovenosa.   Antibiticos para tratar la infeccin.   Medicamentos para Chief Technology Officer y las nuseas en caso de ser necesario.   Si mejora, podr comer alimentos blandos y fciles de Location manager.  Si aparecen complicaciones  como peritonitis o abscesos, podr Paramedic. Este problema es el resultado de la inflamacin de los divertculos. En algunos casos, un absceso puede tratarse colocando un tubo plstico estrecho y flexible (catter). El catter Engineer, agricultural su abdomen hacia el absceso. Por el tubo drenar el lquido infectado. En algunos casos esto evitar la ciruga.  INSTRUCCIONES PARA EL CUIDADO DOMICILIARIO  Tome todos los medicamentos tal como se lo indic el profesional que lo asiste.   Siga una dieta lquida (caldo, t o agua durante tres dias, o segn lo indicado por el profesional que lo asiste). Luego podr gradualmente consumir una dieta blanda segn lo que tolere.   Deber consumir una dieta rica en fibras. Evite nueces y semillas. Siga las indicaciones del profesional para la dieta.   Si el mdico le ha indicado una visita de seguimiento, es muy importante que Oceanographer. No cumplir con este control puede dar como resultado que el dao, el dolor o la discapacidad sean permanentes (crnicos). Si existe algn problema para concurrir al consultorio, llame para pedir otro horario.  SOLICITE ATENCIN MDICA SI:  El dolor no desaparece segn lo esperado.   No puede pasar de la dieta lquida.   Los movimientos intestinales no vuelven a la normalidad.  SOLICITE ATENCIN MDICA DE INMEDIATO SI:  El dolor se agrava.   Usted tiene una temperatura oral de ms de 101 y no puede controlarla con medicamentos.   Presenta vmitos repetidas veces.   Hay sangre en las heces (deposiciones de color rojo brillante o negro alquitranado).   Si los sntomas que lo trajeron a Stage manager o no mejoran.  EST SEGURO QUE:   Comprende las instrucciones  para el alta mdica.   Controlar su enfermedad.   Solicitar atencin mdica de inmediato segn las indicaciones.  Document Released: 06/03/2005 Document Re-Released: 02/11/2010 [facility] Patient Information 2011 [place], [place].

## 2011-05-05 ENCOUNTER — Telehealth: Payer: Self-pay | Admitting: *Deleted

## 2011-05-05 NOTE — Telephone Encounter (Addendum)
Message copied by Dorthula Perfect on Tue May 05, 2011 11:10 AM ------      Message from: Aleen Campi, DAVID S      Created: Mon May 04, 2011  8:09 AM       Lets see her back this week regarding abdominal pain and lab findings.  Hopefully she is feeling much better after the treatment.  Her urine culture shows some infection too but the antibiotics should have resolved this.   Pt notified and is scheduled to return on 05-06-11 at 8:30 am.  CM, LPN

## 2011-05-06 ENCOUNTER — Encounter: Payer: Self-pay | Admitting: Internal Medicine

## 2011-05-06 ENCOUNTER — Encounter: Payer: Self-pay | Admitting: Medical

## 2011-05-06 ENCOUNTER — Ambulatory Visit (INDEPENDENT_AMBULATORY_CARE_PROVIDER_SITE_OTHER): Payer: Commercial Managed Care - PPO | Admitting: Medical

## 2011-05-06 VITALS — BP 140/92 | HR 64 | Temp 97.9°F | Resp 16 | Ht 67.0 in | Wt 186.0 lb

## 2011-05-06 DIAGNOSIS — R1310 Dysphagia, unspecified: Secondary | ICD-10-CM

## 2011-05-06 DIAGNOSIS — R82998 Other abnormal findings in urine: Secondary | ICD-10-CM

## 2011-05-06 DIAGNOSIS — K219 Gastro-esophageal reflux disease without esophagitis: Secondary | ICD-10-CM

## 2011-05-06 DIAGNOSIS — K5792 Diverticulitis of intestine, part unspecified, without perforation or abscess without bleeding: Secondary | ICD-10-CM

## 2011-05-06 DIAGNOSIS — R6881 Early satiety: Secondary | ICD-10-CM

## 2011-05-06 DIAGNOSIS — R8290 Unspecified abnormal findings in urine: Secondary | ICD-10-CM

## 2011-05-06 DIAGNOSIS — K5732 Diverticulitis of large intestine without perforation or abscess without bleeding: Secondary | ICD-10-CM

## 2011-05-06 LAB — POCT URINALYSIS DIPSTICK
Ketones, UA: NEGATIVE
Nitrite, UA: NEGATIVE
Protein, UA: NEGATIVE
Urobilinogen, UA: NEGATIVE

## 2011-05-06 NOTE — Progress Notes (Signed)
Subjective:   HPI  [name] is a 60 y.o. female with hx/o diabetes who presents for recheck. Here with her daughter today who is helping translate.  I saw her on 04/24/11 for symptoms most suggestive of diverticulitis.  Looking back, she had diverticulosis noted on colonoscopy 04/2006.  She was started on Cipro and Flagyl and responded well.  Today she denies any pain, nausea, vomiting, or fever.  Feels much better.  She had mild 15,000 colonization of Enterococcus on urine culture as well without urinary symptoms.    Today her main c/o is difficulty swallowing.  She notes for several years this has been a problem, but its getting worse.  She notes with heavy portions, solids or cold foods, she feels like she can't swallow, feels like she can't breath.  Warm liquids or small portions don't cause any problems.  She notes having a swallow study about 3 years ago, but thinks things were normal.  Study was at [facility].  She is not on medication for GERD at this point.  She does note occasional reflux of water, early satiety.    She was also seen here in July by Dr. Susann Givens for RUQ abdominal pain.  She apparently never went for ultrasound.  She doesn't c/o any abdominal pain at current.  No other aggravating or relieving factors.  No other c/o.  The following portions of the patient's history were reviewed and updated as appropriate: allergies, current medications, past family history, past medical history, past social history, past surgical history and problem list.  Past Medical History  Diagnosis Date  . Hypertension   . Diabetes mellitus   . GERD (gastroesophageal reflux disease)   . Diverticulosis of colon     Review of Systems Constitutional: denies fever, chills, sweats, unexpected weight change, anorexia, fatigue ENT: +hoarse voice; no runny nose, ear pain, sore throat, sinus pain Cardiology: denies chest pain, palpitations, edema Respiratory: denies cough, shortness of breath,  wheezing Gastroenterology: denies abdominal pain, nausea, vomiting, diarrhea, constipation, blood in stool Urology: denies dysuria, difficulty urinating, hematuria, urinary frequency, urgency     Objective:   Physical Exam  General appearance: alert, no distress, WD/WN, Hispanic female HEENT: normocephalic, sclerae anicteric, nares patent, no discharge or erythema, pharynx normal Oral cavity: MMM, no lesions Neck: supple, no lymphadenopathy, no thyromegaly, no masses Heart: RRR, normal S1, S2, no murmurs Lungs: CTA bilaterally, no wheezes, rhonchi, or rales Abdomen: +bs, soft, mild LLQ tenderness, otherwise  non tender, non distended, no masses, no hepatomegaly, no splenomegaly   Assessment :    Encounter Diagnoses  Name Primary?  . Difficulty in swallowing Yes  . Early satiety   . GERD (gastroesophageal reflux disease)   . Diverticulitis   . Abnormal urinalysis       Plan:    Difficulty in swallowing, early satiety, GERD - given her symptoms worsening but present for at least 3 years, we will refer to Dr. Leone Payor who also did her colonoscopy in 2007.  She had a barium swallow study in 01/2009 showing mild esophageal dysmotility and mild GERD.  Diverticulitis - seems to be mostly resolved.  We again discussed symptoms, usual course of illness, and foods to potentially avoid.  Advised that this can recur in the future.  Now she will know the symptom to prompt her to come in.    Abnormal urinalysis on last visit - repeat today clean catch unremarkable.

## 2011-05-08 ENCOUNTER — Other Ambulatory Visit: Payer: Self-pay | Admitting: Family Medicine

## 2011-05-12 ENCOUNTER — Telehealth: Payer: Self-pay | Admitting: Family Medicine

## 2011-05-12 MED ORDER — LISINOPRIL-HYDROCHLOROTHIAZIDE 20-12.5 MG PO TABS: 1.0000 | ORAL_TABLET | Freq: Every day | ORAL | Status: DC

## 2011-05-12 NOTE — Telephone Encounter (Signed)
DONE

## 2011-05-20 ENCOUNTER — Telehealth: Payer: Self-pay | Admitting: Family Medicine

## 2011-05-21 ENCOUNTER — Other Ambulatory Visit: Payer: Self-pay | Admitting: Family Medicine

## 2011-05-21 MED ORDER — SIMVASTATIN 20 MG PO TABS: 20.0000 mg | ORAL_TABLET | ORAL | Status: DC

## 2011-05-21 MED ORDER — METFORMIN HCL 500 MG PO TABS: 500.0000 mg | ORAL_TABLET | Freq: Every day | ORAL | Status: DC

## 2011-05-21 NOTE — Telephone Encounter (Signed)
Let her know that she is supposed to be on the lisinopril, Zocor and I called in the metformin.

## 2011-05-21 NOTE — Telephone Encounter (Signed)
LMTRC

## 2011-06-12 ENCOUNTER — Encounter: Payer: Self-pay | Admitting: Internal Medicine

## 2011-06-12 ENCOUNTER — Ambulatory Visit (INDEPENDENT_AMBULATORY_CARE_PROVIDER_SITE_OTHER): Payer: Commercial Managed Care - PPO | Admitting: Internal Medicine

## 2011-06-12 VITALS — BP 136/74 | HR 62 | Ht 66.0 in | Wt 188.0 lb

## 2011-06-12 DIAGNOSIS — K219 Gastro-esophageal reflux disease without esophagitis: Secondary | ICD-10-CM

## 2011-06-12 DIAGNOSIS — R1310 Dysphagia, unspecified: Secondary | ICD-10-CM | POA: Insufficient documentation

## 2011-06-12 DIAGNOSIS — R1319 Other dysphagia: Secondary | ICD-10-CM

## 2011-06-12 DIAGNOSIS — R1314 Dysphagia, pharyngoesophageal phase: Secondary | ICD-10-CM

## 2011-06-12 NOTE — Assessment & Plan Note (Signed)
She has a history of nausea and some reflux it sounds like. I think she probably has acid reflux disease. Endoscopy will be helpful to sort this out, especially she has inflammatory changes. Suspect she will need some sort of acid suppression but we'll await the endoscopy.

## 2011-06-12 NOTE — Assessment & Plan Note (Addendum)
Her story is consistent with esophageal dysphagia problems and so might impact dysphagia. I suspect she may have an esophageal stricture. Dysmotility as possible. Cancer is in the differential but much less likely. Plan for an EGD with esophageal dilation. I reviewed the risks and benefits with the patient and all questions were answered either in English or through her daughter.

## 2011-06-12 NOTE — Progress Notes (Signed)
  Subjective:    Patient ID: [name], female    DOB: [birth year], 60 y.o.   MRN: [id number]  HPI This 60 year old woman from the [place] is complaining of intermittent solid food dysphagia. She reports to her neck and suprasternal area and says food gets lodged there. She speaks some English and her daughter facilitates the discussion by translating as well. Admission is her native language. As yet she's also having some early satiety problems. She has not had unintentional weight loss. She says the food eventually goes down but she has to wait after it sticks as best I can tell. She has not been vomiting. She is not on any acid pressure at this time. All other GI review of systems are negative except for occasional constipation and anal discomfort with hard stools but that's not a frequent issue. She is up-to-date with colonoscopy screening.   Review of Systems Eyeglasses Blood sugars "ok" All other ROS negative except as above    Objective:   Physical Exam General: Well-developed, well-nourished and in no acute distress - obese Vitals: Reviewed and listed above Eyes:anicteric. Mouth and posterior pharynx: normal.  Neck: supple w/o thyromegaly or mass.  Lungs: clear. Heart: S1S2, no rubs, murmurs, gallops. Abdomen: obese, soft, non-tender, no hepatosplenomegaly, hernia, or mass and BS+. No splash. Lymphatics: no cervical, [place] nodes. Extremities:  no edema Skin no rash. Neuro: nonfocal. A&O x 3.  Psych: appropriate mood and  affect.        Assessment & Plan:

## 2011-06-12 NOTE — Patient Instructions (Addendum)
You should get a flu vaccine by next month. You can do this at your primary care office or a drug store. You have been scheduled for an Endoscopy with separate instructions given.

## 2011-06-15 ENCOUNTER — Encounter: Payer: Self-pay | Admitting: Internal Medicine

## 2011-06-16 ENCOUNTER — Encounter: Payer: Self-pay | Admitting: Internal Medicine

## 2011-06-16 ENCOUNTER — Ambulatory Visit (AMBULATORY_SURGERY_CENTER): Payer: Commercial Managed Care - PPO | Admitting: Internal Medicine

## 2011-06-16 VITALS — BP 126/75 | HR 72 | Temp 96.6°F | Resp 30 | Ht 66.0 in | Wt 188.0 lb

## 2011-06-16 DIAGNOSIS — R1314 Dysphagia, pharyngoesophageal phase: Secondary | ICD-10-CM

## 2011-06-16 DIAGNOSIS — K2990 Gastroduodenitis, unspecified, without bleeding: Secondary | ICD-10-CM

## 2011-06-16 DIAGNOSIS — D131 Benign neoplasm of stomach: Secondary | ICD-10-CM

## 2011-06-16 DIAGNOSIS — R933 Abnormal findings on diagnostic imaging of other parts of digestive tract: Secondary | ICD-10-CM

## 2011-06-16 DIAGNOSIS — K2940 Chronic atrophic gastritis without bleeding: Secondary | ICD-10-CM

## 2011-06-16 DIAGNOSIS — K2970 Gastritis, unspecified, without bleeding: Secondary | ICD-10-CM

## 2011-06-16 DIAGNOSIS — A048 Other specified bacterial intestinal infections: Secondary | ICD-10-CM

## 2011-06-16 HISTORY — PX: UPPER GASTROINTESTINAL ENDOSCOPY: SHX188

## 2011-06-16 MED ORDER — SODIUM CHLORIDE 0.9 % IV SOLN: 500.0000 mL | INTRAVENOUS | Status: DC

## 2011-06-16 MED ORDER — PANTOPRAZOLE SODIUM 40 MG PO TBEC: 40.0000 mg | DELAYED_RELEASE_TABLET | Freq: Every day | ORAL | Status: DC

## 2011-06-16 MED ORDER — ASPIRIN 81 MG PO TABS: 81.0000 mg | ORAL_TABLET | Freq: Every day | ORAL | Status: DC

## 2011-06-16 NOTE — Patient Instructions (Signed)
There were three main findings today: 1) A raised area in the esophagus (plaque) that I am uncertain what it is - biopsied and removed (most likely). Does not look like cancer but will see what the biopsies show. 2) A polyp at the junction of esophagus and stomach - removed. Looks benign. 3) Inflamed stomach - called gastritis. This was biopsied to see what is causing it - looks benign.  No blockage was seen and no dilation performed. Could be needed later but I need to see the pathology results.  I have prescribed a medicine called pantoprazole and you need to get that from your pharmacy and start it today. My office will call with results and plans.  DO NOT TAKE YOUR ASPIRIN AGAIN UNTIL October 23.

## 2011-06-17 ENCOUNTER — Telehealth: Payer: Self-pay | Admitting: *Deleted

## 2011-06-17 NOTE — Telephone Encounter (Signed)

## 2011-06-23 ENCOUNTER — Other Ambulatory Visit: Payer: Self-pay | Admitting: Internal Medicine

## 2011-06-23 ENCOUNTER — Encounter: Payer: Self-pay | Admitting: Internal Medicine

## 2011-06-23 DIAGNOSIS — B9681 Helicobacter pylori [H. pylori] as the cause of diseases classified elsewhere: Secondary | ICD-10-CM

## 2011-06-23 MED ORDER — BIS SUBCIT-METRONID-TETRACYC 140-125-125 MG PO CAPS: 3.0000 | ORAL_CAPSULE | Freq: Three times a day (TID) | ORAL | Status: AC

## 2011-06-23 NOTE — Progress Notes (Signed)
Quick Note:  Office  Let her know biopsies are all benign - she does have H. Pylori gastritis and will need treatment with Pylera - I prescribed it - pick up at pharmacy [facility] in 2 months  LEC  No letter or recall ______

## 2011-07-17 ENCOUNTER — Ambulatory Visit (INDEPENDENT_AMBULATORY_CARE_PROVIDER_SITE_OTHER): Payer: Commercial Managed Care - PPO | Admitting: Medical

## 2011-07-17 ENCOUNTER — Encounter: Payer: Self-pay | Admitting: Medical

## 2011-07-17 DIAGNOSIS — R42 Dizziness and giddiness: Secondary | ICD-10-CM

## 2011-07-17 DIAGNOSIS — G44209 Tension-type headache, unspecified, not intractable: Secondary | ICD-10-CM

## 2011-07-17 DIAGNOSIS — M542 Cervicalgia: Secondary | ICD-10-CM

## 2011-07-17 MED ORDER — MECLIZINE HCL 25 MG PO TABS: ORAL_TABLET | ORAL | Status: DC

## 2011-07-17 MED ORDER — BACLOFEN 10 MG PO TABS: ORAL_TABLET | ORAL | Status: DC

## 2011-07-17 NOTE — Progress Notes (Signed)
Subjective:   HPI  [name] is a 60 y.o. female who presents with neck pain, headache, soreness, and dizziness.  She was in usual state of health feeling fine yesterday.  However she thinks she slept wrong on her pillow.  She awoke with neck and upper back sore, has headache, that progressed up back of head to left temple region.  She took an Aleve this morning which helped a little.  She works 2 jobs, and doesn't think she can work today given the neck pain and headache.  She denies hx/o headache problems, works housekeeping at [employer].  No other aggravating or relieving factors.  Glucose this morning was 125.  No other c/o.  The following portions of the patient's history were reviewed and updated as appropriate: allergies, current medications, past family history, past medical history, past social history, past surgical history and problem list.  Past Medical History  Diagnosis Date  . Hypertension   . Diabetes mellitus   . GERD (gastroesophageal reflux disease)   . Diverticulosis of colon   . Allergic rhinitis   . Obesity   . Cataract    Review of Systems Constitutional: -fever, -chills, -sweats, -unexpected -weight change,+fatigue ENT: -runny nose, -ear pain, -sore throat Cardiology:  -chest pain, -palpitations, -edema Respiratory: -cough, -shortness of breath, -wheezing Gastroenterology: -abdominal pain, -nausea, -vomiting, -diarrhea, -constipation Hematology: -bleeding or bruising problems Musculoskeletal: -arthralgias, -myalgias, -joint swelling, -back pain Ophthalmology: -vision changes Urology: -dysuria, -difficulty urinating, -hematuria, -urinary frequency, -urgency Neurology: +headache, -weakness, -tingling, -numbness    Objective:   Physical Exam  Filed Vitals:   07/17/11 0940  BP: 110/80  Pulse: 80  Temp: 98.3 F (36.8 C)  Resp: 16    General appearance: alert, no distress, WD/WN, Hispanic female Skin: unremarkable HEENT: normocephalic, mild  left side tenderness, sclerae anicteric, TMs pearly, nares patent, no discharge or erythema, pharynx normal Oral cavity: MMM, no lesions Neck: somewhat stiff, tender throughout posterior and bilat neck mild to moderate, decreased ROM mildly due to soreness, no lymphadenopathy, no thyromegaly, no masses Heart: RRR, normal S1, S2, no murmurs Lungs: CTA bilaterally, no wheezes, rhonchi, or rales Pulses: 2+ symmetric, upper extremities Back: tender throughout upper back Neuro: CN2-12 intact, A&Ox3, normal UE strength and sensation, normal DTRs   Assessment and Plan :    Encounter Diagnoses  Name Primary?  . Cervicalgia   . Tension headache   . Dizziness    Advised heat, massage, gentle ROM and stretching, scripts for Baclofen up to TID for a 1-3 days, Meclizine prn, rest, and can use OTC Aleve.  Gave note for work.  Call if worse or not improving in 3- 5days.

## 2011-07-17 NOTE — Patient Instructions (Signed)
I have prescribed 2 medications today.  One medication is a muscle relaxer called Baclofen.  You can use this up to 3 times daily for neck spasm and headache.  The other medication is Meclizine.  You can use this up to 3 times daily for dizziness.    Both of these medications can make you sleepy so don't drive or operate machinery if using these.    Today, I want you to go home rest, lie down in dark quite room, take 1 Baclofen and 1 OTC aleve, and see if this doesn't help a lot.      Dolor de cabeza por tensin (Cefalea por contraccin muscular) (Tension Headache [Muscle Contraction Headache]) Le han diagnosticado una cefalea tensional. Se trata de una de las causas ms frecuentes de dolor de [place]. Generalmente estas cefaleas se sienten como un dolor en la parte superior de la cabeza y en la parte posterior del cuello. El estrs, la ansiedad y la depresin son disparadores frecuentes de Photographer. Las cefaleas por tensin no ponen en peligro la vida y no conducirn a otros tipos de cefalea. Este trastorno puede diagnosticarse a Glass blower/designer de sus antecedentes y de un examen fsico. Algunas veces es necesario realizar anlisis de laboratorio y radiografas para Pharmacist, hospital diagnstico (determinar cul es el problema). El profesional que lo asiste podr ayudarlo a que usted [employer] problemas personales que le ocasionan el estrs o la ansiedad. Podrn prescribirle antidepresivos si el problema es la depresin. INSTRUCCIONES PARA EL CUIDADO DOMICILIARIO  Si le realizan pruebas, comunquese para [employer]. Recuerde: es responsabilidad suya retirar los [employer] de [address]. No piense que est todo bien porque el profesional que lo asiste no lo llama.   Utilice los medicamentos de venta libre o de prescripcin para Chief Technology Officer, Environmental health practitioner o la [place], segn se lo indique el profesional que lo asiste.   Las tcnicas de biorretroalimentacin, los masajes y otras tcnicas de  Microbiologist pueden ser de [place].   Pueden utilizarse bolsas de hielo o calor en la cabeza o en el cuello. Repita tres o cuatro veces por da, o cuando lo necesite.   Adems del [place], puede ser de utilidad la fisioterapia.   Si las cefaleas continan an con Scientist, research (medical), puede ser necesario que comience a pensar en realizar algunos cambios en su estilo de vida.  SOLICITE ATENCIN MDICA SI:  Presenta algn problema con el medicamento que le han prescrito.   No responde o no obtiene [employer].   El dolor de cabeza que senta habitualmente es diferente.   Si tiene nuseas (ganas de vomitar) o vmitos.  SOLICITE ATENCIN MDICA DE INMEDIATO SI:   El dolor de cabeza es cada vez ms intenso.   La temperatura oral se eleva sin motivo por encima de 102 F (38.9 C) o la que el profesional que lo asiste le indique.   Presenta rigidez en el cuello.   Sufre prdida de la visin.   Siente debilidad muscular.   Sufre prdida del control muscular.   Desarrolla sntomas graves diferentes de los primeros.   Comienza a perder el equilibrio o tiene problemas para caminar.   Se marea o pierde el conocimiento.  EST SEGURO QUE:   Comprende las instrucciones para el alta mdica.   Controlar su enfermedad.   Solicitar atencin mdica de inmediato segn las indicaciones.  Document Released: 06/03/2005 Document Revised: 05/06/2011 [facility] Patient Information 2012 [place], [place].

## 2011-08-03 ENCOUNTER — Ambulatory Visit (INDEPENDENT_AMBULATORY_CARE_PROVIDER_SITE_OTHER): Payer: Commercial Managed Care - PPO | Admitting: Family Medicine

## 2011-08-03 ENCOUNTER — Encounter: Payer: Self-pay | Admitting: Family Medicine

## 2011-08-03 VITALS — BP 140/86 | HR 74 | Wt 188.0 lb

## 2011-08-03 DIAGNOSIS — J069 Acute upper respiratory infection, unspecified: Secondary | ICD-10-CM

## 2011-08-03 DIAGNOSIS — B9681 Helicobacter pylori [H. pylori] as the cause of diseases classified elsewhere: Secondary | ICD-10-CM

## 2011-08-03 DIAGNOSIS — E785 Hyperlipidemia, unspecified: Secondary | ICD-10-CM

## 2011-08-03 DIAGNOSIS — K2970 Gastritis, unspecified, without bleeding: Secondary | ICD-10-CM

## 2011-08-03 DIAGNOSIS — I10 Essential (primary) hypertension: Secondary | ICD-10-CM

## 2011-08-03 DIAGNOSIS — E119 Type 2 diabetes mellitus without complications: Secondary | ICD-10-CM

## 2011-08-03 DIAGNOSIS — Z23 Encounter for immunization: Secondary | ICD-10-CM

## 2011-08-03 DIAGNOSIS — A048 Other specified bacterial intestinal infections: Secondary | ICD-10-CM

## 2011-08-03 LAB — CBC WITH DIFFERENTIAL/PLATELET
Eosinophils Absolute: 0.3 10*3/uL (ref 0.0–0.7)
Eosinophils Relative: 5 % (ref 0–5)
HCT: 37.6 % (ref 36.0–46.0)
Hemoglobin: 12.6 g/dL (ref 12.0–15.0)
Lymphocytes Relative: 47 % — ABNORMAL HIGH (ref 12–46)
Lymphs Abs: 3 10*3/uL (ref 0.7–4.0)
MCH: 29.4 pg (ref 26.0–34.0)
MCV: 87.9 fL (ref 78.0–100.0)
Monocytes Absolute: 0.4 10*3/uL (ref 0.1–1.0)
Monocytes Relative: 6 % (ref 3–12)
Platelets: 287 10*3/uL (ref 150–400)
RBC: 4.28 MIL/uL (ref 3.87–5.11)
WBC: 6.4 10*3/uL (ref 4.0–10.5)

## 2011-08-03 LAB — COMPREHENSIVE METABOLIC PANEL
ALT: 17 U/L (ref 0–35)
CO2: 28 mEq/L (ref 19–32)
Calcium: 9.1 mg/dL (ref 8.4–10.5)
Chloride: 106 mEq/L (ref 96–112)
Creat: 0.57 mg/dL (ref 0.50–1.10)
Glucose, Bld: 114 mg/dL — ABNORMAL HIGH (ref 70–99)
Total Bilirubin: 0.5 mg/dL (ref 0.3–1.2)

## 2011-08-03 LAB — LIPID PANEL
Cholesterol: 137 mg/dL (ref 0–200)
Total CHOL/HDL Ratio: 3 Ratio
Triglycerides: 111 mg/dL (ref <150)

## 2011-08-03 MED ORDER — METFORMIN HCL 500 MG PO TABS: 500.0000 mg | ORAL_TABLET | Freq: Every day | ORAL | Status: DC

## 2011-08-03 MED ORDER — SIMVASTATIN 20 MG PO TABS: 20.0000 mg | ORAL_TABLET | ORAL | Status: DC

## 2011-08-03 MED ORDER — LISINOPRIL-HYDROCHLOROTHIAZIDE 20-12.5 MG PO TABS: 1.0000 | ORAL_TABLET | Freq: Every day | ORAL | Status: DC

## 2011-08-03 NOTE — Progress Notes (Signed)
  Subjective:    Patient ID: [name], female    DOB: 1950/10/12, 60 y.o.   MRN: [id number]  HPI She complains of a one-week history of chest congestion and dry cough with headache no fever, chills or sore throat. She is feeling slightly better. She does check her blood sugars periodically and are usually in the low 100s. She states she is taking metformin only once per day. She remains physically active. She does not smoke. She was recently treated for H. pylori. Her record was reviewed. She is to return to see her gastroenterologist in mid December. Presently she is not on a PPI. She does need a refill on several of her medications.   Review of Systems     Objective:   Physical Exam alert and in no distress. Tympanic membranes and canals are normal. Throat is clear. Tonsils are normal. Neck is supple without adenopathy or thyromegaly. Cardiac exam shows a regular sinus rhythm without murmurs or gallops. Lungs are clear to auscultation.        Assessment & Plan:   1. URI (upper respiratory infection)    2. Diabetes mellitus  CBC with Differential, Comprehensive metabolic panel, Lipid panel, POCT HgB A1C  3. Helicobacter pylori gastritis    4. HTN (hypertension)    5. Hyperlipidemia LDL goal <70     supportive care for the URI and call at the end of the week if not continuing to improve. Her medications were renewed. Blood screening as listed above. Recheck here in 4 months.

## 2011-08-03 NOTE — Patient Instructions (Signed)
Followup with Dr. Leone Payor in mid December

## 2011-08-05 ENCOUNTER — Telehealth: Payer: Self-pay | Admitting: Family Medicine

## 2011-08-05 NOTE — Telephone Encounter (Signed)
DONE

## 2011-08-28 ENCOUNTER — Ambulatory Visit (INDEPENDENT_AMBULATORY_CARE_PROVIDER_SITE_OTHER): Payer: Commercial Managed Care - PPO | Admitting: Internal Medicine

## 2011-08-28 ENCOUNTER — Encounter: Payer: Self-pay | Admitting: Internal Medicine

## 2011-08-28 VITALS — BP 134/76 | HR 62 | Ht 65.0 in | Wt 185.0 lb

## 2011-08-28 DIAGNOSIS — E669 Obesity, unspecified: Secondary | ICD-10-CM

## 2011-08-28 DIAGNOSIS — B9681 Helicobacter pylori [H. pylori] as the cause of diseases classified elsewhere: Secondary | ICD-10-CM

## 2011-08-28 DIAGNOSIS — A048 Other specified bacterial intestinal infections: Secondary | ICD-10-CM

## 2011-08-28 DIAGNOSIS — K219 Gastro-esophageal reflux disease without esophagitis: Secondary | ICD-10-CM

## 2011-08-28 NOTE — Assessment & Plan Note (Signed)
She is losing some weight she says, she is encouraged to lose more, trying to lose 10 pounds in the next 6-12 months. Wt Readings from Last 3 Encounters:  08/28/11 185 lb (83.915 kg)  08/03/11 188 lb (85.276 kg)  07/17/11 187 lb (84.823 kg)

## 2011-08-28 NOTE — Assessment & Plan Note (Signed)
She did not have classic findings of this an EGD. She is off PPI and has minimal if any symptoms at this time. Given that, will observe. We'll ask her to be sure she is compliant with diet. She does note reflux when she drinks orange juice. She is advised to change from that.

## 2011-08-28 NOTE — Assessment & Plan Note (Signed)
This has been treated with Pylera. Her early satiety symptoms appear to be gone. I explained the nature of this infection to her. Had not provided. At this point no further treatment or followup is needed.

## 2011-08-28 NOTE — Progress Notes (Signed)
  Subjective:    Patient ID: [name], female    DOB: February 22, 1951, 60 y.o.   MRN: [id number]  HPI this lady returns after a performed an upper endoscopy to investigate what I thought was dysphagia. Should a hyperkeratotic nodule in the distal esophagus, she had a benign gastric polyp removed from the cardia, she had Helicobacter pylori gastritis that was treated with Pylera After 3 weeks of pantoprazole she noticed that dysphagia was gone. There is no early satiety. She is off pantoprazole. Having occasional lower abdominal cramps that are releived by flatus or defecation but do not interfere with quality of life.    Review of Systems As above    Objective:   Physical Exam Well-developed well-nourished overweight obese no acute distress       Assessment & Plan:

## 2011-08-28 NOTE — Patient Instructions (Signed)
Read and follow the GERD diet. Read the information about Helicobacter pylori. Try to lose at least 10 pounds in the next 6 months.

## 2011-11-24 ENCOUNTER — Encounter: Payer: Self-pay | Admitting: Internal Medicine

## 2011-12-01 ENCOUNTER — Ambulatory Visit: Payer: Commercial Managed Care - PPO | Admitting: Family Medicine

## 2011-12-01 ENCOUNTER — Other Ambulatory Visit: Payer: Self-pay | Admitting: Family Medicine

## 2011-12-01 DIAGNOSIS — Z1231 Encounter for screening mammogram for malignant neoplasm of breast: Secondary | ICD-10-CM

## 2011-12-07 ENCOUNTER — Ambulatory Visit: Payer: Commercial Managed Care - PPO | Admitting: Family Medicine

## 2011-12-08 ENCOUNTER — Encounter: Payer: Self-pay | Admitting: Family Medicine

## 2011-12-08 ENCOUNTER — Ambulatory Visit (INDEPENDENT_AMBULATORY_CARE_PROVIDER_SITE_OTHER): Payer: Commercial Managed Care - PPO | Admitting: Family Medicine

## 2011-12-08 DIAGNOSIS — E669 Obesity, unspecified: Secondary | ICD-10-CM

## 2011-12-08 DIAGNOSIS — E119 Type 2 diabetes mellitus without complications: Secondary | ICD-10-CM

## 2011-12-08 DIAGNOSIS — E785 Hyperlipidemia, unspecified: Secondary | ICD-10-CM

## 2011-12-08 DIAGNOSIS — R1011 Right upper quadrant pain: Secondary | ICD-10-CM

## 2011-12-08 DIAGNOSIS — I10 Essential (primary) hypertension: Secondary | ICD-10-CM

## 2011-12-08 LAB — POCT GLYCOSYLATED HEMOGLOBIN (HGB A1C): Hemoglobin A1C: 7

## 2011-12-08 NOTE — Patient Instructions (Signed)
I will call you when I get the results of the ultrasound back.

## 2011-12-08 NOTE — Progress Notes (Signed)
  Subjective:    Patient ID: [name], female    DOB: 01/23/1951, 61 y.o.   MRN: [id number]  HPI She is here for a diabetes recheck. She continues on medications listed in the chart. She has not had an eye exam this year but does plan to set this up. She will do this on her own. She does not smoke or drink. Her exercise is quite minimal. She does occasionally check her blood sugars. For the last 3 weeks she has noted some right upper quadrant bloating and gas especially when she drinks milk. She does not eat fried foods. She's had no nausea, vomiting or diarrhea.   Review of Systems     Objective:   Physical Exam Alert and in no distress. Cardiac exam shows regular rhythm without murmurs or gallops. Lungs clear to auscultation. Abdominal exam shows questionable right upper quadrant tenderness but negative Murphy's punch. Hemoglobin A1c is 7.0.       Assessment & Plan:   1. Diabetes mellitus  POCT HgB A1C  2. HTN (hypertension)    3. Obesity    4. Hyperlipidemia LDL goal <70    5. Right upper quadrant pain  US Abdomen Limited RUQ   she will set up an appointment with her eye doctor. Followup pending results of ultrasound.

## 2011-12-09 ENCOUNTER — Other Ambulatory Visit: Payer: Self-pay

## 2011-12-09 ENCOUNTER — Ambulatory Visit
Admission: RE | Admit: 2011-12-09 | Discharge: 2011-12-09 | Disposition: A | Discharge status: Home / Self Care | Payer: Commercial Managed Care - PPO | Source: Ambulatory Visit | Attending: Family Medicine | Admitting: Family Medicine

## 2011-12-09 DIAGNOSIS — R1011 Right upper quadrant pain: Secondary | ICD-10-CM

## 2011-12-17 ENCOUNTER — Encounter (HOSPITAL_COMMUNITY): Payer: Self-pay

## 2011-12-17 ENCOUNTER — Encounter (HOSPITAL_COMMUNITY)
Admission: RE | Admit: 2011-12-17 | Discharge: 2011-12-17 | Disposition: A | Discharge status: Home / Self Care | Payer: Commercial Managed Care - PPO | Source: Ambulatory Visit | Attending: Family Medicine | Admitting: Family Medicine

## 2011-12-17 DIAGNOSIS — R1011 Right upper quadrant pain: Secondary | ICD-10-CM | POA: Insufficient documentation

## 2011-12-17 MED ORDER — SINCALIDE 5 MCG IJ SOLR: 0.0200 ug/kg | Freq: Once | INTRAMUSCULAR | Status: DC

## 2011-12-17 MED ORDER — TECHNETIUM TC 99M MEBROFENIN IV KIT
5.5000 | PACK | Freq: Once | INTRAVENOUS | Status: AC | PRN
  Administered 2011-12-17: 5.5 via INTRAVENOUS

## 2011-12-18 NOTE — Progress Notes (Signed)
Advised pt of lab results.

## 2011-12-21 ENCOUNTER — Telehealth: Payer: Self-pay | Admitting: Family Medicine

## 2011-12-21 NOTE — Telephone Encounter (Signed)
Let her know that symptoms are probably lactose intolerance and the best way to handle this is to avoid milk and milk products.

## 2011-12-21 NOTE — Telephone Encounter (Signed)
Pt was notified and had me speak with her daughter who spoke english for clarification

## 2011-12-29 ENCOUNTER — Ambulatory Visit (HOSPITAL_COMMUNITY)
Admission: RE | Admit: 2011-12-29 | Discharge: 2011-12-29 | Disposition: A | Discharge status: Home / Self Care | Payer: Commercial Managed Care - PPO | Source: Ambulatory Visit | Attending: Family Medicine | Admitting: Family Medicine

## 2011-12-29 ENCOUNTER — Ambulatory Visit (HOSPITAL_COMMUNITY): Payer: Self-pay

## 2011-12-29 DIAGNOSIS — Z1231 Encounter for screening mammogram for malignant neoplasm of breast: Secondary | ICD-10-CM | POA: Insufficient documentation

## 2012-01-05 ENCOUNTER — Other Ambulatory Visit: Payer: Self-pay | Admitting: Family Medicine

## 2012-02-03 ENCOUNTER — Ambulatory Visit (INDEPENDENT_AMBULATORY_CARE_PROVIDER_SITE_OTHER): Payer: Commercial Managed Care - PPO | Admitting: Family Medicine

## 2012-02-03 ENCOUNTER — Encounter: Payer: Self-pay | Admitting: Family Medicine

## 2012-02-03 VITALS — BP 130/80 | HR 82 | Wt 191.0 lb

## 2012-02-03 DIAGNOSIS — G479 Sleep disorder, unspecified: Secondary | ICD-10-CM

## 2012-02-03 DIAGNOSIS — G478 Other sleep disorders: Secondary | ICD-10-CM

## 2012-02-03 DIAGNOSIS — R4586 Emotional lability: Secondary | ICD-10-CM

## 2012-02-03 DIAGNOSIS — F39 Unspecified mood [affective] disorder: Secondary | ICD-10-CM

## 2012-02-03 NOTE — Progress Notes (Signed)
  Subjective:    Patient ID: [name], female    DOB: May 28, 1951, 61 y.o.   MRN: [id number]  HPI She is here for consultation concerning referral for counseling. She would like to be referred to a therapist. She admits to having difficulty with sleep as well as feeling depressed, crying and mood swings. She forces herself to get through the day. She cannot relate this to anything in particular is causing this. This has been going on for the last several months.   Review of Systems     Objective:   Physical Exam Alert and in no distress and tearful. She is appropriately dressed.      Assessment & Plan:   1. Sleep disturbance, unspecified   2. Mood and affect disturbance    referral will be made to a psychologist.

## 2012-07-05 ENCOUNTER — Ambulatory Visit (INDEPENDENT_AMBULATORY_CARE_PROVIDER_SITE_OTHER): Payer: Commercial Managed Care - PPO | Admitting: Internal Medicine

## 2012-07-05 ENCOUNTER — Encounter: Payer: Self-pay | Admitting: Internal Medicine

## 2012-07-05 VITALS — BP 152/90 | HR 72 | Ht 64.5 in | Wt 195.5 lb

## 2012-07-05 DIAGNOSIS — H9209 Otalgia, unspecified ear: Secondary | ICD-10-CM

## 2012-07-05 DIAGNOSIS — R1031 Right lower quadrant pain: Secondary | ICD-10-CM

## 2012-07-05 DIAGNOSIS — K219 Gastro-esophageal reflux disease without esophagitis: Secondary | ICD-10-CM

## 2012-07-05 DIAGNOSIS — R1310 Dysphagia, unspecified: Secondary | ICD-10-CM

## 2012-07-05 MED ORDER — PANTOPRAZOLE SODIUM 40 MG PO TBEC: 40.0000 mg | DELAYED_RELEASE_TABLET | Freq: Every day | ORAL | Status: DC

## 2012-07-05 NOTE — Patient Instructions (Addendum)
We have sent the following medications to your pharmacy for you to pick up at your convenience: Pantoprazole  Please go and see your PCP regarding your ear issues.  Thank you for choosing me and [facility].  Iva Boop, M.D., [facility]

## 2012-07-05 NOTE — Progress Notes (Signed)
  Subjective:    Patient ID: [name], female    DOB: 04-Sep-1951, 61 y.o.   MRN: [id number]  HPI This middle-aged Hispanic woman is here with her daughter who helped with translation the patient does speak [place]. She was seen last year with dysphagia she underwent upper GI endoscopy and had a benign plaque in the esophagus there was removed as well as H. pyloric gastritis. She was treated with pile where a. She was given a proton pump inhibitor. When she returns for followup in December she had no complaints. Now for several weeks or more she has had problems with swallowing difficulty. He is in association with rhinorrhea, nasal congestion and year pain when she chews, the pain is bilateral. He may have had a fever back in September. Review shows that she has been prescribed Augmentin in September as well as recently been given a promethazine and codeine prescription for cough. She does not remember who her primary care physician is at this time, he does speak [place] and she sees him on [employer] but cannot remember the clinic. She is also describing some suprasternal sticking of food, almost like a lump her globus sensation at times as well. This is somewhat vague. She has gained weight. There is also some early satiety and bloating at times.  Her other complaint today is that of intermittent right lower quadrant pain is transient. It's mild and it is not necessarily new problem she has described as before. Medications, allergies, past medical history, past surgical history, family history and social history are reviewed and updated in the EMR.  Review of Systems As above    Objective:   Physical Exam General:  NAD Eyes:   Anicteric, not injected Pharynx: Clear Lungs:  clear Heart:  S1S2 no rubs, murmurs or gallops Abdomen:  soft and nontender, BS+   Data Reviewed: Previous endoscopy and colonoscopy reports. Pathology reports.     Assessment & Plan:   1. Dysphagia   2. GERD  (gastroesophageal reflux disease)   3. RLQ abdominal pain   4. Ear pain    1. Restart PPI, pantoprazole 40 mg daily before breakfast. 2. If this does not help dysphagia and GERD symptoms, after 2 months, she is to return. I've explained this to the patient and her daughter. 3. Many of her symptoms sound like any her nose and throat problem, perhaps she is eustachian tube dysfunction. I recommended she returned her primary care provider about this. Plain that is out of my area of expertise. 4. Think she probably has IBS as well. We'll monitor her symptoms of dyspepsia and abdominal pain, that may respond to PPI at least the bloating and early satiety.

## 2012-08-05 ENCOUNTER — Other Ambulatory Visit: Payer: Self-pay

## 2012-08-05 MED ORDER — LISINOPRIL-HYDROCHLOROTHIAZIDE 20-12.5 MG PO TABS: 1.0000 | ORAL_TABLET | Freq: Every day | ORAL | Status: DC

## 2012-08-05 NOTE — Telephone Encounter (Signed)
SENT IN B/P

## 2012-12-07 ENCOUNTER — Other Ambulatory Visit: Payer: Self-pay | Admitting: Family Medicine

## 2012-12-07 DIAGNOSIS — Z1231 Encounter for screening mammogram for malignant neoplasm of breast: Secondary | ICD-10-CM

## 2012-12-29 ENCOUNTER — Ambulatory Visit (HOSPITAL_COMMUNITY)
Admission: RE | Admit: 2012-12-29 | Discharge: 2012-12-29 | Disposition: A | Discharge status: Home / Self Care | Payer: Commercial Managed Care - PPO | Source: Ambulatory Visit | Attending: Family Medicine | Admitting: Family Medicine

## 2012-12-29 DIAGNOSIS — Z1231 Encounter for screening mammogram for malignant neoplasm of breast: Secondary | ICD-10-CM | POA: Insufficient documentation

## 2013-06-29 ENCOUNTER — Telehealth: Payer: Self-pay | Admitting: Family Medicine

## 2013-06-29 ENCOUNTER — Other Ambulatory Visit: Payer: Self-pay | Admitting: *Deleted

## 2013-06-29 MED ORDER — METFORMIN HCL 500 MG PO TABS: 500.0000 mg | ORAL_TABLET | Freq: Every day | ORAL | Status: DC

## 2013-06-29 MED ORDER — LISINOPRIL-HYDROCHLOROTHIAZIDE 20-12.5 MG PO TABS: 1.0000 | ORAL_TABLET | Freq: Every day | ORAL | Status: DC

## 2013-06-29 NOTE — Telephone Encounter (Signed)
Done for 30 days, left message for pt to call ASAP for appt with JCL as she has not been seen since 01/2012.

## 2013-06-30 ENCOUNTER — Telehealth: Payer: Self-pay | Admitting: Family Medicine

## 2013-06-30 NOTE — Telephone Encounter (Signed)
Done

## 2013-06-30 NOTE — Telephone Encounter (Signed)
This shouldn't have been refilled.  Last diabetes check 4/13.   Needs appt for diabetes check.

## 2013-06-30 NOTE — Telephone Encounter (Signed)
Is this okay to switch? i seen where it was sent before but regular metformin was sent to.

## 2013-07-03 MED ORDER — METFORMIN HCL ER 500 MG PO TB24: 500.0000 mg | ORAL_TABLET | Freq: Every day | ORAL | Status: DC

## 2013-07-03 NOTE — Telephone Encounter (Signed)
S/W pharmacist & pt has been getting ER, not plain.  Per Vincenza Hews ok refill #30/0 to hold til appt

## 2013-07-04 ENCOUNTER — Ambulatory Visit (INDEPENDENT_AMBULATORY_CARE_PROVIDER_SITE_OTHER): Payer: Commercial Managed Care - PPO | Admitting: Family Medicine

## 2013-07-04 ENCOUNTER — Encounter: Payer: Self-pay | Admitting: Family Medicine

## 2013-07-04 VITALS — BP 140/100 | HR 70 | Wt 195.0 lb

## 2013-07-04 DIAGNOSIS — K219 Gastro-esophageal reflux disease without esophagitis: Secondary | ICD-10-CM

## 2013-07-04 DIAGNOSIS — E119 Type 2 diabetes mellitus without complications: Secondary | ICD-10-CM

## 2013-07-04 DIAGNOSIS — E669 Obesity, unspecified: Secondary | ICD-10-CM

## 2013-07-04 DIAGNOSIS — Z23 Encounter for immunization: Secondary | ICD-10-CM

## 2013-07-04 MED ORDER — AMLODIPINE BESYLATE 5 MG PO TABS: 5.0000 mg | ORAL_TABLET | Freq: Every day | ORAL | Status: DC

## 2013-07-04 NOTE — Progress Notes (Signed)
Subjective:    Patient ID: [name], female    DOB: 08-16-1951, 62 y.o.   MRN: [id number]  HPI    Review of Systems     Objective:   Physical Exam        Assessment & Plan:    Subjective:    [name] is a 62 y.o. female who presents for follow-up of Type 2 diabetes mellitus.   She has 2 jobs as keeps her busy and apparently interferes with her eating habits. Her reflux is under good control. Home blood sugar records: 131  Current symptoms/problems NO Daily foot checks:Any foot concerns:NONE Last eye exam:  1`0/8/14   Medication compliance: Current diet: NONE Current exercise: NO Known diabetic complications: none Cardiovascular risk factors: diabetes mellitus, dyslipidemia, hypertension and obesity (BMI >= 30 kg/m2)   The following portions of the patient's history were reviewed and updated as appropriate: allergies, current medications, past family history, past medical history, past social history and problem list.  ROS as in subjective above    Objective:    There were no vitals taken for this visit.  There were no vitals filed for this visit.  General appearence: alert, no distress, WD/WN Neck: supple, no lymphadenopathy, no thyromegaly, no masses Heart: RRR, normal S1, S2, no murmurs Lungs: CTA bilaterally, no wheezes, rhonchi, or rales Abdomen: +bs, soft, non tender, non distended, no masses, no hepatomegaly, no splenomegaly Pulses: 2+ symmetric, upper and lower extremities, normal cap refill Ext: no edema Foot exam:  Neuro: foot monofilament exam normal   Lab Review Lab Results  Component Value Date   HGBA1C 7.0 12/08/2011   Lab Results  Component Value Date   CHOL 137 08/03/2011   HDL 45 08/03/2011   LDLCALC 70 08/03/2011   TRIG 111 08/03/2011   CHOLHDL 3.0 08/03/2011   No results found for this basenameConcepcion Elk     Chemistry      Component Value Date/Time   NA 141 08/03/2011 0952   K 4.3 08/03/2011 0952   CL 106 08/03/2011 0952   CO2 28 08/03/2011 0952   BUN 15 08/03/2011 0952   CREATININE 0.57 08/03/2011 0952      Component Value Date/Time   CALCIUM 9.1 08/03/2011 0952   ALKPHOS 75 08/03/2011 0952   AST 14 08/03/2011 0952   ALT 17 08/03/2011 0952   BILITOT 0.5 08/03/2011 0952        Chemistry      Component Value Date/Time   NA 141 08/03/2011 0952   K 4.3 08/03/2011 0952   CL 106 08/03/2011 0952   CO2 28 08/03/2011 0952   BUN 15 08/03/2011 0952   CREATININE 0.57 08/03/2011 0952      Component Value Date/Time   CALCIUM 9.1 08/03/2011 0952   ALKPHOS 75 08/03/2011 0952   AST 14 08/03/2011 0952   ALT 17 08/03/2011 0952   BILITOT 0.5 08/03/2011 0952       Hemoglobin A1c is 6.7    Assessment:   Encounter Diagnoses  Name Primary?  . Diabetes mellitus Yes  . Need for prophylactic vaccination and inoculation against influenza   . Obesity   . GERD (gastroesophageal reflux disease)   . Need for prophylactic vaccination and inoculation against unspecified single disease          Plan:    1.  Rx changes: none 2.  Education: Reviewed 'ABCs' of diabetes management (respective goals in parentheses):  A1C (<7), blood pressure (<130/80), and cholesterol (LDL <100). 3.  Compliance at present is estimated to be fair. Efforts to improve compliance (if necessary) will be directed at dietary modifications: Reduce carbohydrate. 4. Follow up: 4 months  HM possible referred to nutritionist was discussed however she declined. Flu shot given with risks and benefits discussed

## 2013-08-07 ENCOUNTER — Other Ambulatory Visit: Payer: Self-pay

## 2013-08-07 MED ORDER — LISINOPRIL-HYDROCHLOROTHIAZIDE 20-12.5 MG PO TABS: 1.0000 | ORAL_TABLET | Freq: Every day | ORAL | Status: DC

## 2013-08-07 NOTE — Telephone Encounter (Signed)
Sent b/p med in 

## 2013-11-03 ENCOUNTER — Ambulatory Visit: Payer: Commercial Managed Care - PPO | Admitting: Family Medicine

## 2013-12-25 ENCOUNTER — Other Ambulatory Visit: Payer: Self-pay | Admitting: Family Medicine

## 2013-12-25 DIAGNOSIS — Z1231 Encounter for screening mammogram for malignant neoplasm of breast: Secondary | ICD-10-CM

## 2014-01-01 ENCOUNTER — Encounter: Payer: Self-pay | Admitting: Family Medicine

## 2014-01-01 ENCOUNTER — Ambulatory Visit (INDEPENDENT_AMBULATORY_CARE_PROVIDER_SITE_OTHER): Payer: BC Managed Care – PPO | Admitting: Family Medicine

## 2014-01-01 VITALS — BP 146/94 | HR 60 | Ht 65.0 in | Wt 193.0 lb

## 2014-01-01 DIAGNOSIS — I10 Essential (primary) hypertension: Secondary | ICD-10-CM

## 2014-01-01 DIAGNOSIS — E1159 Type 2 diabetes mellitus with other circulatory complications: Secondary | ICD-10-CM

## 2014-01-01 DIAGNOSIS — E669 Obesity, unspecified: Secondary | ICD-10-CM

## 2014-01-01 DIAGNOSIS — Z0000 Encounter for general adult medical examination without abnormal findings: Secondary | ICD-10-CM

## 2014-01-01 DIAGNOSIS — I152 Hypertension secondary to endocrine disorders: Secondary | ICD-10-CM

## 2014-01-01 DIAGNOSIS — N3281 Overactive bladder: Secondary | ICD-10-CM | POA: Insufficient documentation

## 2014-01-01 DIAGNOSIS — J309 Allergic rhinitis, unspecified: Secondary | ICD-10-CM

## 2014-01-01 DIAGNOSIS — E1169 Type 2 diabetes mellitus with other specified complication: Secondary | ICD-10-CM

## 2014-01-01 DIAGNOSIS — E119 Type 2 diabetes mellitus without complications: Secondary | ICD-10-CM

## 2014-01-01 DIAGNOSIS — K219 Gastro-esophageal reflux disease without esophagitis: Secondary | ICD-10-CM

## 2014-01-01 DIAGNOSIS — R7611 Nonspecific reaction to tuberculin skin test without active tuberculosis: Secondary | ICD-10-CM

## 2014-01-01 DIAGNOSIS — K5730 Diverticulosis of large intestine without perforation or abscess without bleeding: Secondary | ICD-10-CM

## 2014-01-01 DIAGNOSIS — N318 Other neuromuscular dysfunction of bladder: Secondary | ICD-10-CM

## 2014-01-01 DIAGNOSIS — K5790 Diverticulosis of intestine, part unspecified, without perforation or abscess without bleeding: Secondary | ICD-10-CM

## 2014-01-01 LAB — LIPID PANEL
CHOL/HDL RATIO: 3.4 ratio
CHOLESTEROL: 186 mg/dL (ref 0–200)
HDL: 55 mg/dL (ref >39)
LDL Cholesterol: 107 mg/dL — ABNORMAL HIGH (ref 0–99)
TRIGLYCERIDES: 120 mg/dL (ref <150)
VLDL: 24 mg/dL (ref 0–40)

## 2014-01-01 LAB — CBC WITH DIFFERENTIAL/PLATELET
Basophils Absolute: 0.1 10*3/uL (ref 0.0–0.1)
Basophils Relative: 1 % (ref 0–1)
EOS PCT: 6 % — AB (ref 0–5)
Eosinophils Absolute: 0.4 10*3/uL (ref 0.0–0.7)
HCT: 38.5 % (ref 36.0–46.0)
HEMOGLOBIN: 13.2 g/dL (ref 12.0–15.0)
LYMPHS ABS: 2.8 10*3/uL (ref 0.7–4.0)
Lymphocytes Relative: 46 % (ref 12–46)
MCH: 29.5 pg (ref 26.0–34.0)
MCHC: 34.3 g/dL (ref 30.0–36.0)
MCV: 85.9 fL (ref 78.0–100.0)
Monocytes Absolute: 0.4 10*3/uL (ref 0.1–1.0)
Monocytes Relative: 7 % (ref 3–12)
Neutro Abs: 2.4 10*3/uL (ref 1.7–7.7)
Neutrophils Relative %: 40 % — ABNORMAL LOW (ref 43–77)
Platelets: 286 10*3/uL (ref 150–400)
RBC: 4.48 MIL/uL (ref 3.87–5.11)
RDW: 15.6 % — ABNORMAL HIGH (ref 11.5–15.5)
WBC: 6.1 10*3/uL (ref 4.0–10.5)

## 2014-01-01 LAB — COMPREHENSIVE METABOLIC PANEL
ALT: 17 U/L (ref 0–35)
AST: 13 U/L (ref 0–37)
Albumin: 4.2 g/dL (ref 3.5–5.2)
Alkaline Phosphatase: 89 U/L (ref 39–117)
BUN: 15 mg/dL (ref 6–23)
CO2: 30 meq/L (ref 19–32)
CREATININE: 0.59 mg/dL (ref 0.50–1.10)
Calcium: 9.6 mg/dL (ref 8.4–10.5)
Chloride: 102 mEq/L (ref 96–112)
Glucose, Bld: 122 mg/dL — ABNORMAL HIGH (ref 70–99)
Potassium: 4.5 mEq/L (ref 3.5–5.3)
Sodium: 139 mEq/L (ref 135–145)
Total Bilirubin: 0.6 mg/dL (ref 0.2–1.2)
Total Protein: 7 g/dL (ref 6.0–8.3)

## 2014-01-01 LAB — POCT GLYCOSYLATED HEMOGLOBIN (HGB A1C): Hemoglobin A1C: 7.6

## 2014-01-01 MED ORDER — SOLIFENACIN SUCCINATE 5 MG PO TABS
5.0000 mg | ORAL_TABLET | Freq: Every day | ORAL | Status: DC
Start: 2014-01-01 — End: 2014-04-23

## 2014-01-01 NOTE — Patient Instructions (Signed)
Take the new medicine called Vesicare and let me know how it works. Call me in a month

## 2014-01-01 NOTE — Progress Notes (Signed)
Subjective:    Patient ID: [name], female    DOB: 09-06-1951, 63 y.o.   MRN: [id number]  HPI  Ms. [name] is a very pleasant 63 y.o. yo female who  has a past medical history of Hypertension; Diabetes mellitus; GERD (gastroesophageal reflux disease); Diverticulosis of colon; Allergic rhinitis; Obesity; Cataract; Helicobacter pylori gastritis; and HLD (hyperlipidemia). She presents today for an annual physical.   The patient is doing well overall but does have an acute complaint she would like to discuss today. Two months ago the patient noticed that she was having trouble holding in her urine. The patient states that she has to use the bathroom urgently when she feels the need to urinate and also has trouble holding her urine when she sneezes or coughs. The patient denies any burning with urination, fever or weight loss. The patient is a G4P4.  The patient has also recently had trouble with her eyes and vision. This is being managed by her opthalmologist who has started her on eye drops and methotrexate.     The patient is also being followed by gastroenterology for a constant constipation issue. 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 Per the patient, she has never been treated for tuberculosis.  The patient checks her blood sugars 3 times a week at 7:30 am and they are normally around 110. The patient reports that her diet is "normal" but that she tries to limit sugars and eat smaller portions. The patient does however crave and eat deserts occasionally. The patient reports that her exercise is her job which causes her to stand on her feet at all times. The patient's last eye exam was in March and she examines her feet regularly. She denies any new numbness or tingling in her feet.   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   The patient notes that her allergies are improving and her OTC nexium controls her gastritis. The patient's headaches still occur once a month and are well managed with rest and NSAIDS.  The patient lives alone and feels supported by her son who visits. The patient does not use alcohol or tobacco.  Health Maintenance: The patient is UTD on her mammogram and has an appointment scheduled for tomorrow. The patient does not require a pap smear due to history of hysterectomy. The patient's last colonoscopy was in 2007.   The patient is due for her zoster vaccine today but is refusing it at the current time. The patient states that the stress of her eye condition is affecting her and she would like to think about the vaccine for a while before receiving it.   Review of Systems is negative except per HPI.    Objective:   Physical Exam  Constitutional: Patient is well-developed, well-nourished, and in no distress. HENT: Head is normocephalic and atraumatic.  Mouth/Throat: Oropharynx is clear and moist without erythema or exudates Eyes: Conjunctivae and EOM are normal. Pupils are equal, round, and reactive to light.  Cardiovascular: Normal rate, regular rhythm. Exam reveals no murmurs, gallops and no friction rub.  Pulmonary/Chest: Effort normal and CTAB. No respiratory distress. No wheezes or ronchi.   Abdominal: Soft, non-tender and non-distended. There is no rebound or guarding. No HSM.  Neurological: Patient is alert and oriented to person, place, and time.  Reflex Scores: Biceps and Patellar reflexes were 2+ and equal bilaterally.  Skin: Skin is warm and dry. No rash noted.  Feet: Clean and dry with normal monofilament exam.  Psychiatric: Affect normal.   Assessment & Plan:   Routine general medical examination at a health care facility - Plan: CBC with Differential, Comprehensive metabolic panel, Lipid panel  Allergic rhinitis  Diabetes mellitus - Plan: CBC  with Differential, Comprehensive metabolic panel, Lipid panel, POCT UA - Microalbumin, POCT glycosylated hemoglobin (Hb A1C)  Diverticulosis  GERD (gastroesophageal reflux disease)  Hypertension associated with diabetes - Plan: CBC with Differential, Comprehensive metabolic panel  Obesity  OAB (overactive bladder) - Plan: solifenacin (VESICARE) 5 MG tablet  History of positive PPD, treatment status unknown

## 2014-01-02 ENCOUNTER — Ambulatory Visit (HOSPITAL_COMMUNITY): Payer: Commercial Managed Care - PPO

## 2014-02-05 ENCOUNTER — Other Ambulatory Visit: Payer: Self-pay | Admitting: Family Medicine

## 2014-02-05 MED ORDER — METFORMIN HCL ER 500 MG PO TB24: 500.0000 mg | ORAL_TABLET | Freq: Every day | ORAL | Status: DC

## 2014-02-05 MED ORDER — LISINOPRIL-HYDROCHLOROTHIAZIDE 20-12.5 MG PO TABS: 1.0000 | ORAL_TABLET | Freq: Every day | ORAL | Status: DC

## 2014-02-05 NOTE — Telephone Encounter (Signed)
Pt needs refill on lisinopril and metformin

## 2014-02-06 ENCOUNTER — Telehealth: Payer: Self-pay | Admitting: Family Medicine

## 2014-02-06 MED ORDER — METFORMIN HCL ER 500 MG PO TB24: 500.0000 mg | ORAL_TABLET | Freq: Every day | ORAL | Status: DC

## 2014-02-06 NOTE — Telephone Encounter (Signed)
Metformin sent in.  

## 2014-02-06 NOTE — Telephone Encounter (Signed)
Please call patient  concerning Metformin Rx, pharmacy told pt we only sent in linisnopril , she needs metformin Rx also

## 2014-02-12 ENCOUNTER — Ambulatory Visit (INDEPENDENT_AMBULATORY_CARE_PROVIDER_SITE_OTHER): Payer: BC Managed Care – PPO | Admitting: Internal Medicine

## 2014-02-12 ENCOUNTER — Encounter: Payer: Self-pay | Admitting: Internal Medicine

## 2014-02-12 VITALS — BP 158/92 | HR 64 | Ht 64.5 in | Wt 193.2 lb

## 2014-02-12 DIAGNOSIS — R141 Gas pain: Secondary | ICD-10-CM

## 2014-02-12 DIAGNOSIS — K644 Residual hemorrhoidal skin tags: Secondary | ICD-10-CM

## 2014-02-12 DIAGNOSIS — R142 Eructation: Secondary | ICD-10-CM

## 2014-02-12 DIAGNOSIS — R140 Abdominal distension (gaseous): Secondary | ICD-10-CM

## 2014-02-12 DIAGNOSIS — R143 Flatulence: Secondary | ICD-10-CM

## 2014-02-12 DIAGNOSIS — R6881 Early satiety: Secondary | ICD-10-CM

## 2014-02-12 MED ORDER — HYDROCORTISONE ACE-PRAMOXINE 1-1 % RE CREA: 1.0000 "application " | TOPICAL_CREAM | Freq: Two times a day (BID) | RECTAL | Status: DC | PRN

## 2014-02-12 NOTE — Patient Instructions (Addendum)
Your physician has requested that you go to the basement for the following lab work before leaving today: Stool studies  Today we have given you a benefiber sheet to read and follow.  We have given you information on hemorrhoids to read.   Return in August to see [place].  Appointment made for 04/23/14 at 8:30am.   I appreciate the opportunity to care for you.

## 2014-02-12 NOTE — Progress Notes (Signed)
         Subjective:    Patient ID: [name], female    DOB: December 04, 1950, 63 y.o.   MRN: [id number]  HPI The patient is here for followup, she presents with an interpreter. She has limited [place] skills, native language Spanish.  She has several gastrointestinal complaints. #1 is that of an anal itching sensation or like a bug is calling around after she defecates. There is no rectal bleeding. She moves her bowels most days, without straining or struggled to sometimes she has to wipe excessively.  When she eats she feels full quickly, and this lasts about 2 hours and then disappears. Bloating and early satiety. No vomiting or nausea. She has had this problem in the past and I found her to have H. pylori gastritis which was treated with pile where and the symptoms went away. These symptoms have been present again for about 4 months.  She has a heaviness in the right lower quadrant at times, this disappears with defecation.  She denies neuropathy symptoms. Last hemoglobin A1c 7.6 in April. She is not anemic. Her sleep is not disturb her GI symptoms though she has a dry mouth in the morning.  The symptoms do not seem to coincide with any medication usage.  Medications, allergies, past medical history, past surgical history, family history and social history are reviewed and updated in the EMR.   Review of Systems As per history of present illness    Objective:   Physical Exam General:  NAD Eyes:   anicteric Lungs:  clear Heart:  S1S2 no rubs, murmurs or gallops Abdomen:  soft and nontender, BS+, obese, no splash Ext:   no edema  Rectal exam: Gabrielle Martinique CMA present  Anoderm inspection revealed right ant/post tags Anal wink was present Digital exam revealed normal resting tone and voluntary squeeze. No mass but small rectocele present. Simulated defecation with valsalva revealed appropriate abdominal contraction and descent.     Anoscopy was performed with the patient in  the left lateral decubitus position while a chaperone was present and revealed small external hemorrhoids RP and RA  Data Reviewed:  Labs in the EMR, prior GI notes and colonoscopy 2007 EGD 2012. Primary care notes also.    Assessment & Plan:   1. Early satiety   2. Bloating   3. External hemorrhoids with complication   4. Anal skin tag    I am suspicious of functional disturbance related to diabetes or perhaps irritable bowel syndrome and symptomatic hemorrhoids. 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  1. H. Pylori stool Ag 2. Benefiber 1-2 tbsp/day 3. HC 2.5% bid, post defecation 4. Call results - does have [place] at home 5. RTC 04/2014 She may need a TSH upon return    Meds ordered this encounter  Medications  . pramoxine-hydrocortisone (PROCTOCREAM-HC) 1-1 % rectal cream    Sig: Place 1 application rectally 2 (two) times daily as needed for hemorrhoids or itching (After bowel movement and or at bedtime).    Dispense:  30 g    Refill:  1     I appreciate the opportunity to care for this patient. CC: Wyatt Haste, MD

## 2014-02-13 ENCOUNTER — Other Ambulatory Visit: Payer: BC Managed Care – PPO

## 2014-02-13 DIAGNOSIS — R140 Abdominal distension (gaseous): Secondary | ICD-10-CM

## 2014-02-13 DIAGNOSIS — K644 Residual hemorrhoidal skin tags: Secondary | ICD-10-CM

## 2014-02-13 DIAGNOSIS — R6881 Early satiety: Secondary | ICD-10-CM

## 2014-02-15 ENCOUNTER — Other Ambulatory Visit: Payer: Self-pay

## 2014-02-15 ENCOUNTER — Telehealth: Payer: Self-pay

## 2014-02-15 DIAGNOSIS — R6881 Early satiety: Secondary | ICD-10-CM

## 2014-02-15 LAB — H. PYLORI ANTIGEN, STOOL: H pylori Ag, Stl: NEGATIVE

## 2014-02-15 MED ORDER — HYDROCORTISONE ACE-PRAMOXINE 2.5-1 % RE CREA: 1.0000 "application " | TOPICAL_CREAM | Freq: Two times a day (BID) | RECTAL | Status: DC | PRN

## 2014-02-15 NOTE — Telephone Encounter (Signed)
Pharmacy called having trouble getting the Proctocream [facility] , they have ordered it twice.  They want an ok to replace it with the hydrocortisone 1%/pramoxine 2.5% rectal cream, this is ok per Barb Merino, [facility] and pharmacy notified.

## 2014-02-15 NOTE — Progress Notes (Signed)
Quick Note:  Let her know no helicobacter stomach infection Ask her to do a TSH Otherwise no new recommendations ______

## 2014-02-16 ENCOUNTER — Other Ambulatory Visit (INDEPENDENT_AMBULATORY_CARE_PROVIDER_SITE_OTHER): Payer: BC Managed Care – PPO

## 2014-02-16 DIAGNOSIS — R6881 Early satiety: Secondary | ICD-10-CM

## 2014-02-16 LAB — TSH: TSH: 0.76 u[IU]/mL (ref 0.35–4.50)

## 2014-02-19 NOTE — Progress Notes (Signed)
Quick Note:  Let her know thyroid is working normally ______

## 2014-04-23 ENCOUNTER — Ambulatory Visit (INDEPENDENT_AMBULATORY_CARE_PROVIDER_SITE_OTHER): Payer: BC Managed Care – PPO | Admitting: Internal Medicine

## 2014-04-23 ENCOUNTER — Encounter: Payer: Self-pay | Admitting: Internal Medicine

## 2014-04-23 VITALS — BP 144/90 | HR 72 | Ht 64.5 in | Wt 190.4 lb

## 2014-04-23 DIAGNOSIS — K644 Residual hemorrhoidal skin tags: Secondary | ICD-10-CM

## 2014-04-23 DIAGNOSIS — R6881 Early satiety: Secondary | ICD-10-CM

## 2014-04-23 NOTE — Patient Instructions (Signed)
Continue your current medicines and follow up with [place] as needed.   I appreciate the opportunity to care for you.

## 2014-04-23 NOTE — Progress Notes (Signed)
   Subjective:    Patient ID: [name], female    DOB: [birth year], 63 y.o.   MRN: [id number]  HPI Naliah is without early satiety and hemorrhoid sxs under control. Feeling ok. BS 138 yesterday AM  Medications, allergies, past medical history, past surgical history, family history and social history are reviewed and updated in the EMR.  Review of Systems As above    Objective:   Physical Exam WDWN obese NAD  Wt Readings from Last 3 Encounters:  04/23/14 190 lb 6 oz (86.354 kg)  02/12/14 193 lb 4 oz (87.658 kg)  01/01/14 193 lb (87.544 kg)       Assessment & Plan:  Early satiety  External hemorrhoids with complication  Both resolved at this time. She will see me prn Colonoscopy 2017  F/U DM and BP with PCP

## 2014-05-03 ENCOUNTER — Encounter: Payer: Self-pay | Admitting: Medical

## 2014-05-03 ENCOUNTER — Ambulatory Visit (INDEPENDENT_AMBULATORY_CARE_PROVIDER_SITE_OTHER): Payer: BC Managed Care – PPO | Admitting: Medical

## 2014-05-03 ENCOUNTER — Ambulatory Visit (HOSPITAL_COMMUNITY)
Admission: RE | Admit: 2014-05-03 | Discharge: 2014-05-03 | Disposition: A | Discharge status: Home / Self Care | Payer: BC Managed Care – PPO | Source: Ambulatory Visit | Attending: Medical | Admitting: Medical

## 2014-05-03 ENCOUNTER — Other Ambulatory Visit (HOSPITAL_COMMUNITY): Payer: Self-pay | Admitting: Medical

## 2014-05-03 ENCOUNTER — Other Ambulatory Visit: Payer: Self-pay

## 2014-05-03 VITALS — BP 120/80 | HR 78 | Temp 97.9°F | Resp 16 | Wt 193.0 lb

## 2014-05-03 DIAGNOSIS — M79605 Pain in left leg: Secondary | ICD-10-CM

## 2014-05-03 DIAGNOSIS — I8390 Asymptomatic varicose veins of unspecified lower extremity: Secondary | ICD-10-CM

## 2014-05-03 DIAGNOSIS — R0989 Other specified symptoms and signs involving the circulatory and respiratory systems: Secondary | ICD-10-CM

## 2014-05-03 DIAGNOSIS — M25569 Pain in unspecified knee: Secondary | ICD-10-CM

## 2014-05-03 DIAGNOSIS — M79609 Pain in unspecified limb: Secondary | ICD-10-CM | POA: Insufficient documentation

## 2014-05-03 DIAGNOSIS — M25562 Pain in left knee: Secondary | ICD-10-CM

## 2014-05-03 NOTE — Progress Notes (Signed)
Subjective: Here today with a Spanish interpreter.  She normally sees Dr. Redmond School.  Here for complaints of left knee and leg pain, left leg/ankle swelling.  She reports a one-week history of left leg pain throughout her left lower leg. Thinks it started with the knee, but not completely sure.   She denies injury, trauma, fall. She does report working 13 hour days recently has been on her feet more. The left knee has felt swollen and tender, but in the last week the leg in general has been tender from the calf down to the ankle. She feels like her foot is cold at times.  She denies recent surgery, accident, no history of DVT, no recent long travel, no shortness of breath, no chest pain.  She denies numbness or tingling in the foot, primarily just pain throughout the lower leg.  She is taking nothing for this.  She can't take aspirin due to prior gastritis issues with aspirin.  The leg pain is 5/10.  She does note varicose veins and spider veins in her left leg for years.  No other aggravating or relieving factors.  No other complaint.  ROS as in subjective   Objective: BP 120/80  Pulse 78  Temp(Src) 97.9 F (36.6 C) (Oral)  Resp 16  Wt 193 lb (87.544 kg)  General appearance: alert, no distress, WD/WN Heart: RRR, normal S1, S2, no murmurs Lungs: CTA bilaterally, no wheezes, rhonchi, or rales MSK: Tender over left patella, mild pain with flexion of the knee, no obvious deformity or laxity, no erythema or warmth Ext: There are several patches of spider veins of the left lower leg, mild varicosities of the left lower leg, mild ankle edema on the left. No frank asymmetry of the left leg compared to right, however she is tender throughout the whole entire left calf, no palpable cord Pedal pulses 1+ bilat, somewhat decreased capillary refill of the left foot compared to right   Assessment: Encounter Diagnoses  Name Primary?  . Knee pain, left Yes  . Leg pain, inferior, left   . Decreased pulses in  feet   . Prolonged capillary refill time   . Varicose vein of leg     Plan I suspect her knee pain and swelling is due to the recent increase work hours being on her leg for prolonged periods causing inflammation.  She clearly has varicosities and spider veins and venous insufficiency.  However today on exam her foot felt somewhat food or than the other leg, and the capillary refills were decreased in the left foot.  Pedal pulses 1+ bilaterally.  We will send her for x-ray of the left knee, Doppler ultrasound to rule out DVT and to evaluate the venous flow, ABIs as well.  Advise relative rest, can use ibuprofen short-term for the knee pain and leg pain.  Followup pending studies

## 2014-05-03 NOTE — Progress Notes (Signed)
*  Preliminary Results* Left lower extremity venous duplex completed. Left lower extremity is negative for deep vein thrombosis. There is no evidence of left Baker's cyst.  05/03/2014 5:23 PM  Maudry Mayhew, RVT, RDCS, RDMS

## 2014-05-04 ENCOUNTER — Ambulatory Visit
Admission: RE | Admit: 2014-05-04 | Discharge: 2014-05-04 | Disposition: A | Discharge status: Home / Self Care | Payer: BC Managed Care – PPO | Source: Ambulatory Visit | Attending: Medical | Admitting: Medical

## 2014-05-04 ENCOUNTER — Ambulatory Visit (HOSPITAL_COMMUNITY)
Admission: RE | Admit: 2014-05-04 | Discharge: 2014-05-04 | Disposition: A | Discharge status: Home / Self Care | Payer: BC Managed Care – PPO | Source: Ambulatory Visit | Attending: Medical | Admitting: Medical

## 2014-05-04 DIAGNOSIS — M79605 Pain in left leg: Secondary | ICD-10-CM

## 2014-05-04 DIAGNOSIS — M79609 Pain in unspecified limb: Secondary | ICD-10-CM | POA: Diagnosis not present

## 2014-05-04 DIAGNOSIS — M25562 Pain in left knee: Secondary | ICD-10-CM

## 2014-05-04 DIAGNOSIS — I8390 Asymptomatic varicose veins of unspecified lower extremity: Secondary | ICD-10-CM

## 2014-05-04 DIAGNOSIS — E119 Type 2 diabetes mellitus without complications: Secondary | ICD-10-CM | POA: Insufficient documentation

## 2014-05-04 DIAGNOSIS — R0989 Other specified symptoms and signs involving the circulatory and respiratory systems: Secondary | ICD-10-CM

## 2014-05-04 NOTE — Progress Notes (Signed)
VASCULAR LAB PRELIMINARY  ARTERIAL  ABI completed:    RIGHT    LEFT    PRESSURE WAVEFORM  PRESSURE WAVEFORM  BRACHIAL 171 Triphasic BRACHIAL 152 Triphasic  DP 166 Triphasic DP 158 Triphasic  PT 159 Biphasic PT 159 Sharp Monophasic     PER 167 Biphasic    RIGHT LEFT  ABI 0.97 0.98   ABIs  And Doppler waveforms are within normal limits bilaterally .  [name], RVS 05/04/2014, 1:52 PM

## 2014-06-05 ENCOUNTER — Ambulatory Visit (INDEPENDENT_AMBULATORY_CARE_PROVIDER_SITE_OTHER): Payer: BC Managed Care – PPO | Admitting: Medical

## 2014-06-05 ENCOUNTER — Encounter: Payer: Self-pay | Admitting: Medical

## 2014-06-05 VITALS — BP 140/82 | HR 80 | Temp 98.1°F | Resp 16

## 2014-06-05 DIAGNOSIS — M62830 Muscle spasm of back: Secondary | ICD-10-CM

## 2014-06-05 DIAGNOSIS — Z23 Encounter for immunization: Secondary | ICD-10-CM

## 2014-06-05 DIAGNOSIS — M5380 Other specified dorsopathies, site unspecified: Secondary | ICD-10-CM

## 2014-06-05 DIAGNOSIS — IMO0002 Reserved for concepts with insufficient information to code with codable children: Secondary | ICD-10-CM

## 2014-06-05 DIAGNOSIS — S39012A Strain of muscle, fascia and tendon of lower back, initial encounter: Secondary | ICD-10-CM

## 2014-06-05 MED ORDER — NAPROXEN SODIUM 220 MG PO TABS: 220.0000 mg | ORAL_TABLET | Freq: Two times a day (BID) | ORAL | Status: DC

## 2014-06-05 MED ORDER — CYCLOBENZAPRINE HCL 10 MG PO TABS: 10.0000 mg | ORAL_TABLET | Freq: Every day | ORAL | Status: DC
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
## 2014-06-28 ENCOUNTER — Telehealth: Payer: Self-pay | Admitting: Internal Medicine

## 2014-06-28 ENCOUNTER — Other Ambulatory Visit: Payer: Self-pay

## 2014-06-28 MED ORDER — METFORMIN HCL ER 500 MG PO TB24: 500.0000 mg | ORAL_TABLET | Freq: Every day | ORAL | Status: DC

## 2014-06-28 NOTE — Telephone Encounter (Signed)
done

## 2014-06-28 NOTE — Telephone Encounter (Signed)
Refill request for glucophage Xr 500mg  #90 to [facility]

## 2014-07-07 ENCOUNTER — Telehealth: Payer: Self-pay | Admitting: Internal Medicine

## 2014-07-07 NOTE — Telephone Encounter (Signed)
Faxed over medical records to [facility]

## 2014-08-01 ENCOUNTER — Ambulatory Visit (INDEPENDENT_AMBULATORY_CARE_PROVIDER_SITE_OTHER): Payer: BC Managed Care – PPO | Admitting: Family Medicine

## 2014-08-01 ENCOUNTER — Encounter: Payer: Self-pay | Admitting: Family Medicine

## 2014-08-01 VITALS — BP 130/80 | HR 76 | Ht 65.0 in | Wt 187.0 lb

## 2014-08-01 DIAGNOSIS — E119 Type 2 diabetes mellitus without complications: Secondary | ICD-10-CM

## 2014-08-01 DIAGNOSIS — Z23 Encounter for immunization: Secondary | ICD-10-CM

## 2014-08-01 DIAGNOSIS — I152 Hypertension secondary to endocrine disorders: Secondary | ICD-10-CM

## 2014-08-01 DIAGNOSIS — I10 Essential (primary) hypertension: Secondary | ICD-10-CM

## 2014-08-01 DIAGNOSIS — E1159 Type 2 diabetes mellitus with other circulatory complications: Secondary | ICD-10-CM

## 2014-08-01 DIAGNOSIS — H409 Unspecified glaucoma: Secondary | ICD-10-CM

## 2014-08-01 DIAGNOSIS — E785 Hyperlipidemia, unspecified: Secondary | ICD-10-CM

## 2014-08-01 DIAGNOSIS — E1136 Type 2 diabetes mellitus with diabetic cataract: Secondary | ICD-10-CM

## 2014-08-01 DIAGNOSIS — E1169 Type 2 diabetes mellitus with other specified complication: Secondary | ICD-10-CM

## 2014-08-01 LAB — POCT UA - MICROALBUMIN
ALBUMIN/CREATININE RATIO, URINE, POC: 6.3
Creatinine, POC: 129.2 mg/dL
MICROALBUMIN (UR) POC: 8.1 mg/L

## 2014-08-01 LAB — POCT GLYCOSYLATED HEMOGLOBIN (HGB A1C): Hemoglobin A1C: 7.7

## 2014-08-01 MED ORDER — SIMVASTATIN 20 MG PO TABS: 20.0000 mg | ORAL_TABLET | Freq: Every day | ORAL | Status: DC

## 2014-08-01 MED ORDER — METFORMIN HCL 1000 MG PO TABS: 1000.0000 mg | ORAL_TABLET | Freq: Every day | ORAL | Status: DC

## 2014-08-01 NOTE — Progress Notes (Signed)
  Subjective:    [name] is a 63 y.o. female who presents for follow-up of Type 2 diabetes mellitus.    Home blood sugar records: Patient test one time a day Patient has interp. [name]  Current symptoms/problems none Daily foot checks:    Any foot concerns:  yes/fungus big toe Last eye exam:  1 year 3 mths ago  Both eyes cataracts replaced lens. Also has a history of glaucoma. She is being followed by an ophthalmologist for this and a different one for her cataract.   Medication compliance: Good Current diet: low carb Current exercise: walking Known diabetic complications: none Cardiovascular risk factors: advanced age (older than 16 for men, 64 for women), diabetes mellitus, dyslipidemia, hypertension and obesity (BMI >= 30 kg/m2)   The following portions of the patient's history were reviewed and updated as appropriate: allergies, current medications, past family history, past medical history, past social history and past surgical history.  ROS as in subjective above    Objective:    General appearence: alert, no distress, WD/WN   Lab Review Lab Results  Component Value Date   HGBA1C 7.6% 01/01/2014   Lab Results  Component Value Date   CHOL 186 01/01/2014   HDL 55 01/01/2014   LDLCALC 107* 01/01/2014   TRIG 120 01/01/2014   CHOLHDL 3.4 01/01/2014   No results found for: Derl Barrow   Chemistry      Component Value Date/Time   NA 139 01/01/2014 1233   K 4.5 01/01/2014 1233   CL 102 01/01/2014 1233   CO2 30 01/01/2014 1233   BUN 15 01/01/2014 1233   CREATININE 0.59 01/01/2014 1233      Component Value Date/Time   CALCIUM 9.6 01/01/2014 1233   ALKPHOS 89 01/01/2014 1233   AST 13 01/01/2014 1233   ALT 17 01/01/2014 1233   BILITOT 0.6 01/01/2014 1233        Chemistry      Component Value Date/Time   NA 139 01/01/2014 1233   K 4.5 01/01/2014 1233   CL 102 01/01/2014 1233   CO2 30 01/01/2014 1233   BUN 15 01/01/2014  1233   CREATININE 0.59 01/01/2014 1233      Component Value Date/Time   CALCIUM 9.6 01/01/2014 1233   ALKPHOS 89 01/01/2014 1233   AST 13 01/01/2014 1233   ALT 17 01/01/2014 1233   BILITOT 0.6 01/01/2014 1233      A1c is 7.7  Assessment:  Type 2 diabetes mellitus without complication - Plan: POCT glycosylated hemoglobin (Hb A1C), POCT UA - Microalbumin, metFORMIN (GLUCOPHAGE) 1000 MG tablet  Need for Zostavax administration - Plan: Varicella-zoster vaccine subcutaneous  Cataract associated with type 2 diabetes mellitus  Hypertension associated with diabetes  Hyperlipidemia associated with type 2 diabetes mellitus - Plan: simvastatin (ZOCOR) 20 MG tablet  Glaucoma (increased eye pressure)        Plan:    1.  Rx changes: She will start back on Zocor since she stopped it for no apparent reason. I will also increase her metformin to 1000 per day. 2.  Education: Reviewed 'ABCs' of diabetes management (respective goals in parentheses):  A1C (<7), blood pressure (<130/80), and cholesterol (LDL <100). 3.  Compliance at present is estimated to be fair. Efforts to improve compliance (if necessary) will be directed at increased exercise. 4. Follow up: 4 months   She will continue to see the ophthalmologist.

## 2014-10-03 ENCOUNTER — Other Ambulatory Visit: Payer: Self-pay

## 2014-10-03 MED ORDER — GLUCOSE BLOOD VI STRP: ORAL_STRIP | Status: DC

## 2014-10-03 MED ORDER — ONETOUCH DELICA LANCETS FINE MISC: 1.0000 | Freq: Two times a day (BID) | Status: DC

## 2014-12-06 ENCOUNTER — Ambulatory Visit (INDEPENDENT_AMBULATORY_CARE_PROVIDER_SITE_OTHER): Payer: 59 | Admitting: Family Medicine

## 2014-12-06 ENCOUNTER — Encounter: Payer: Self-pay | Admitting: Family Medicine

## 2014-12-06 VITALS — BP 120/88 | HR 76 | Temp 98.2°F | Resp 16 | Ht 65.0 in | Wt 185.0 lb

## 2014-12-06 DIAGNOSIS — E669 Obesity, unspecified: Secondary | ICD-10-CM | POA: Diagnosis not present

## 2014-12-06 DIAGNOSIS — E1169 Type 2 diabetes mellitus with other specified complication: Secondary | ICD-10-CM | POA: Diagnosis not present

## 2014-12-06 DIAGNOSIS — H409 Unspecified glaucoma: Secondary | ICD-10-CM | POA: Diagnosis not present

## 2014-12-06 DIAGNOSIS — E119 Type 2 diabetes mellitus without complications: Secondary | ICD-10-CM | POA: Diagnosis not present

## 2014-12-06 DIAGNOSIS — E785 Hyperlipidemia, unspecified: Secondary | ICD-10-CM | POA: Diagnosis not present

## 2014-12-06 DIAGNOSIS — J0140 Acute pansinusitis, unspecified: Secondary | ICD-10-CM | POA: Diagnosis not present

## 2014-12-06 DIAGNOSIS — I152 Hypertension secondary to endocrine disorders: Secondary | ICD-10-CM

## 2014-12-06 DIAGNOSIS — I10 Essential (primary) hypertension: Secondary | ICD-10-CM | POA: Diagnosis not present

## 2014-12-06 DIAGNOSIS — J301 Allergic rhinitis due to pollen: Secondary | ICD-10-CM

## 2014-12-06 DIAGNOSIS — E1159 Type 2 diabetes mellitus with other circulatory complications: Secondary | ICD-10-CM

## 2014-12-06 LAB — POCT GLYCOSYLATED HEMOGLOBIN (HGB A1C): HEMOGLOBIN A1C: 7.2

## 2014-12-06 MED ORDER — AMOXICILLIN 875 MG PO TABS: 875.0000 mg | ORAL_TABLET | Freq: Two times a day (BID) | ORAL | Status: DC

## 2014-12-06 NOTE — Patient Instructions (Addendum)
Use Flonase nasal spray and the Allegra and I will put on an antibiotic stay on all your other medicines. Take all the antibiotic and if there is any question come back and we can reassess you when you finish the antibiotic

## 2014-12-06 NOTE — Progress Notes (Signed)
   Subjective:    Patient ID: [name], female    DOB: 10-12-1950, 64 y.o.   MRN: [id number]  HPI She is here for a 4 month follow up of her type 2 diabetes. Translator [name] present. She has been checking her blood sugar twice daily. Before breakfast her readings have been 130-140s and 2 after after evening meal 150- 163. Good compliance with her blood pressure, cholesterol and diabetes medications. She reports a healthy diet and does not exercise but works 13 hour days and is quite active on her job.  Checks her feet daily and denies problems. She does not smoke Has an appointment with Opthomalogist in May, they are following her glaucoma. She denies any vision problems today.   She reports a 5 day history of productive cough, nasal congestion, purulent rhinorrhea, sinus pressure, and right ear discomfort. She also states she felt feverish 2 days ago. Denies ST, body aches. She has seasonal allergies and reports this time of year pollen is especially bothersome to her. She started taking Allegra 3 days ago and this has not helped. She did receive a flu shot this year.   Reviewed medications, past medical and social history and immunizations.   Review of Systems  All other systems reviewed and are negative.      Objective:   Physical Exam  Alert and in no distress. Tympanic membranes and canals are normal. Nasal mucus membranes are slightly erythematous. Tenderness to maxillary sinuses, left greater than right. Throat is clear. Neck is supple without adenopathy. Cardiac exam shows a regular sinus rhythm without murmurs or gallops. Lungs are clear to auscultation.  A1C 7.2     Assessment & Plan:   Type 2 diabetes mellitus without complication - Plan: POCT glycosylated hemoglobin (Hb A1C)  Acute pansinusitis, recurrence not specified - Plan: amoxicillin (AMOXIL) 875 MG tablet  Hypertension associated with diabetes  Allergic rhinitis due to  pollen  Obesity  Hyperlipidemia associated with type 2 diabetes mellitus  Glaucoma (increased eye pressure)  Continue taking all prescribed medications as well as Allegra. Use Flonase nasal spray for symptom relief and complete the antibiotic. Follow up if your condition worsens and also once completing the antibiotic if you are not feeling much better. Discussed that underlying allergies may still be present and that this will need to be managed with antihistamine and nasal spray.  See the Opthamolgist as scheduled in May. Once you are feeling better, try to get in a 20 minutes of walking each day and cut back on carbohydrates.

## 2014-12-11 ENCOUNTER — Telehealth: Payer: Self-pay | Admitting: Internal Medicine

## 2014-12-11 NOTE — Telephone Encounter (Signed)
Faxed over medical records to [facility] @ [phone] and [phone] on March 9th,2016

## 2015-01-01 ENCOUNTER — Other Ambulatory Visit: Payer: Self-pay

## 2015-01-01 DIAGNOSIS — Z1231 Encounter for screening mammogram for malignant neoplasm of breast: Secondary | ICD-10-CM

## 2015-01-09 ENCOUNTER — Ambulatory Visit (HOSPITAL_COMMUNITY)
Admission: RE | Admit: 2015-01-09 | Discharge: 2015-01-09 | Disposition: A | Discharge status: Home / Self Care | Payer: 59 | Source: Ambulatory Visit | Attending: Family Medicine | Admitting: Family Medicine

## 2015-01-09 DIAGNOSIS — Z1231 Encounter for screening mammogram for malignant neoplasm of breast: Secondary | ICD-10-CM | POA: Insufficient documentation

## 2015-01-11 ENCOUNTER — Ambulatory Visit: Payer: Self-pay

## 2015-02-18 ENCOUNTER — Other Ambulatory Visit: Payer: Self-pay | Admitting: Family Medicine

## 2015-02-19 ENCOUNTER — Telehealth: Payer: Self-pay | Admitting: Family Medicine

## 2015-02-19 NOTE — Telephone Encounter (Signed)
Informed pt per chart, lisinopril Rx sent to [facility] yesterday

## 2015-04-08 ENCOUNTER — Encounter: Payer: Self-pay | Admitting: Family Medicine

## 2015-04-08 ENCOUNTER — Ambulatory Visit (INDEPENDENT_AMBULATORY_CARE_PROVIDER_SITE_OTHER): Payer: 59 | Admitting: Family Medicine

## 2015-04-08 VITALS — BP 126/88 | HR 75 | Ht 65.0 in | Wt 188.0 lb

## 2015-04-08 DIAGNOSIS — E1136 Type 2 diabetes mellitus with diabetic cataract: Secondary | ICD-10-CM

## 2015-04-08 DIAGNOSIS — I10 Essential (primary) hypertension: Secondary | ICD-10-CM | POA: Diagnosis not present

## 2015-04-08 DIAGNOSIS — E669 Obesity, unspecified: Secondary | ICD-10-CM | POA: Diagnosis not present

## 2015-04-08 DIAGNOSIS — E119 Type 2 diabetes mellitus without complications: Secondary | ICD-10-CM | POA: Diagnosis not present

## 2015-04-08 DIAGNOSIS — I152 Hypertension secondary to endocrine disorders: Secondary | ICD-10-CM

## 2015-04-08 DIAGNOSIS — E1169 Type 2 diabetes mellitus with other specified complication: Secondary | ICD-10-CM | POA: Diagnosis not present

## 2015-04-08 DIAGNOSIS — K219 Gastro-esophageal reflux disease without esophagitis: Secondary | ICD-10-CM

## 2015-04-08 DIAGNOSIS — E785 Hyperlipidemia, unspecified: Secondary | ICD-10-CM

## 2015-04-08 DIAGNOSIS — E1159 Type 2 diabetes mellitus with other circulatory complications: Secondary | ICD-10-CM

## 2015-04-08 DIAGNOSIS — M25562 Pain in left knee: Secondary | ICD-10-CM

## 2015-04-08 DIAGNOSIS — H409 Unspecified glaucoma: Secondary | ICD-10-CM | POA: Diagnosis not present

## 2015-04-08 DIAGNOSIS — J301 Allergic rhinitis due to pollen: Secondary | ICD-10-CM | POA: Diagnosis not present

## 2015-04-08 DIAGNOSIS — N3281 Overactive bladder: Secondary | ICD-10-CM

## 2015-04-08 LAB — CBC WITH DIFFERENTIAL/PLATELET
Basophils Absolute: 0 10*3/uL (ref 0.0–0.1)
Basophils Relative: 0 % (ref 0–1)
Eosinophils Absolute: 0.2 10*3/uL (ref 0.0–0.7)
Eosinophils Relative: 5 % (ref 0–5)
HEMATOCRIT: 37.6 % (ref 36.0–46.0)
HEMOGLOBIN: 12.8 g/dL (ref 12.0–15.0)
Lymphocytes Relative: 43 % (ref 12–46)
Lymphs Abs: 2.1 10*3/uL (ref 0.7–4.0)
MCH: 29.2 pg (ref 26.0–34.0)
MCHC: 34 g/dL (ref 30.0–36.0)
MCV: 85.8 fL (ref 78.0–100.0)
MPV: 8.8 fL (ref 8.6–12.4)
Monocytes Absolute: 0.4 10*3/uL (ref 0.1–1.0)
Monocytes Relative: 8 % (ref 3–12)
Neutro Abs: 2.1 10*3/uL (ref 1.7–7.7)
Neutrophils Relative %: 44 % (ref 43–77)
Platelets: 286 10*3/uL (ref 150–400)
RBC: 4.38 MIL/uL (ref 3.87–5.11)
RDW: 15.2 % (ref 11.5–15.5)
WBC: 4.8 10*3/uL (ref 4.0–10.5)

## 2015-04-08 LAB — COMPREHENSIVE METABOLIC PANEL
ALT: 21 U/L (ref 6–29)
AST: 14 U/L (ref 10–35)
Albumin: 3.9 g/dL (ref 3.6–5.1)
Alkaline Phosphatase: 70 U/L (ref 33–130)
BUN: 11 mg/dL (ref 7–25)
CO2: 29 mmol/L (ref 20–31)
Calcium: 9.4 mg/dL (ref 8.6–10.4)
Chloride: 102 mmol/L (ref 98–110)
Creat: 0.54 mg/dL (ref 0.50–0.99)
Glucose, Bld: 145 mg/dL — ABNORMAL HIGH (ref 65–99)
Potassium: 4.5 mmol/L (ref 3.5–5.3)
Sodium: 140 mmol/L (ref 135–146)
Total Bilirubin: 0.5 mg/dL (ref 0.2–1.2)
Total Protein: 6.9 g/dL (ref 6.1–8.1)

## 2015-04-08 LAB — POCT GLYCOSYLATED HEMOGLOBIN (HGB A1C): Hemoglobin A1C: 7.5

## 2015-04-08 LAB — POCT UA - MICROALBUMIN
Albumin/Creatinine Ratio, Urine, POC: 4.7
CREATININE, POC: 106.3 mg/dL
Microalbumin Ur, POC: 5 mg/L

## 2015-04-08 LAB — LIPID PANEL
CHOL/HDL RATIO: 3.2 ratio (ref <=5.0)
CHOLESTEROL: 177 mg/dL (ref 125–200)
HDL: 55 mg/dL (ref >=46)
LDL CALC: 102 mg/dL (ref <130)
Triglycerides: 100 mg/dL (ref <150)
VLDL: 20 mg/dL (ref <30)

## 2015-04-08 MED ORDER — ONETOUCH DELICA LANCETS FINE MISC: 1.0000 | Freq: Two times a day (BID) | Status: DC

## 2015-04-08 MED ORDER — LISINOPRIL-HYDROCHLOROTHIAZIDE 20-12.5 MG PO TABS: 1.0000 | ORAL_TABLET | Freq: Every day | ORAL | Status: DC

## 2015-04-08 MED ORDER — METFORMIN HCL 1000 MG PO TABS: 1000.0000 mg | ORAL_TABLET | Freq: Every day | ORAL | Status: DC

## 2015-04-08 MED ORDER — SIMVASTATIN 20 MG PO TABS: 20.0000 mg | ORAL_TABLET | Freq: Every day | ORAL | Status: DC

## 2015-04-08 MED ORDER — GLUCOSE BLOOD VI STRP: ORAL_STRIP | Status: DC

## 2015-04-08 NOTE — Patient Instructions (Signed)
Use Tylenol first for your knee pain.

## 2015-04-08 NOTE — Progress Notes (Signed)
  Subjective:    Patient ID: [name], female    DOB: [birth year], 64 y.o.   MRN: [id number]  [name] is a 64 y.o. female who presents for follow-up of Type 2 diabetes mellitus.  Home blood sugar records: Patient checks BID in the 150's Current symptoms/problems none Daily foot checks: yes   Any foot concerns:  None Exercise: Working 13 to 14 hours Eye; Dr.Boone 04/03/15 Glaucoma but all is okay She does complain of a several month history of left knee pain. No history of injury. There is some slight popping in it. She also has reflux symptoms and does use Nexium with good results. She is having no trouble from her OAB ;she states that her allergies are under good control. The following portions of the patient's history were reviewed and updated as appropriate: allergies, current medications, past medical history, past social history and problem list.  ROS as in subjective above.     Objective:    Physical Exam Alert and in no distress. Foot exam is recorded and is normal. Left knee exam does show questionable small effusion. Some tenderness over medial and lateral joint lines. McMurray's testing negative. Anterior drawer negative. Patellar compression test negative.   Lab Review Diabetic Labs Latest Ref Rng 12/06/2014 08/01/2014 01/01/2014 07/04/2013 12/08/2011  HbA1c - 7.2 7.7 7.6% 6.7 7.0  Chol 0 - 200 mg/dL - - 186 - -  HDL >39 mg/dL - - 55 - -  Calc LDL 0 - 99 mg/dL - - 107(H) - -  Triglycerides <150 mg/dL - - 120 - -  Creatinine 0.50 - 1.10 mg/dL - - 0.59 - -   BP/Weight 12/06/2014 08/01/2014 06/05/2014 05/03/2014 2/70/6237  Systolic BP 628 315 176 160 737  Diastolic BP 88 80 82 80 90  Wt. (Lbs) 185 187 - 193 190.38  BMI 30.79 31.12 - 32.63 32.19   [name]  reports that she has never smoked. She has never used smokeless tobacco. She reports that she does not drink alcohol or use illicit drugs.     Assessment & Plan:    Hypertension associated with diabetes - Plan:  lisinopril-hydrochlorothiazide (PRINZIDE,ZESTORETIC) 20-12.5 MG per tablet  Obesity  Gastroesophageal reflux disease without esophagitis  Allergic rhinitis due to pollen  Hyperlipidemia associated with type 2 diabetes mellitus - Plan: POCT UA - Microalbumin, simvastatin (ZOCOR) 20 MG tablet  Glaucoma (increased eye pressure)  OAB (overactive bladder)  Left knee pain - Plan: DG Knee Complete 4 Views Left  Type 2 diabetes mellitus without complication - Plan: POCT glycosylated hemoglobin (Hb A1C), CBC with Differential/Platelet, Comprehensive metabolic panel, Lipid panel, glucose blood test strip, metFORMIN (GLUCOPHAGE) 1000 MG tablet, ONETOUCH DELICA LANCETS FINE MISC  Cataract associated with type 2 diabetes mellitus   1. Rx changes: none 2. Education: Reviewed 'ABCs' of diabetes management (respective goals in parentheses):  A1C (<7), blood pressure (<130/80), and cholesterol (LDL <100). 3. Compliance at present is estimated to be fair. Efforts to improve compliance (if necessary) will be directed at increased exercise. 4. Follow up: 4 months  5. I suspect the knee pain is arthritic in nature. Discussed the need for her to use Tylenol first and then the Naprosyn. Her interpreter was with her today. Over 45 minutes, greater than 50% spent in counseling and coordination of care.

## 2015-04-30 ENCOUNTER — Ambulatory Visit
Admission: RE | Admit: 2015-04-30 | Discharge: 2015-04-30 | Disposition: A | Discharge status: Home / Self Care | Payer: 59 | Source: Ambulatory Visit | Attending: Family Medicine | Admitting: Family Medicine

## 2015-04-30 DIAGNOSIS — M25562 Pain in left knee: Secondary | ICD-10-CM

## 2015-06-27 ENCOUNTER — Encounter: Payer: Self-pay | Admitting: Medical

## 2015-06-27 ENCOUNTER — Ambulatory Visit (INDEPENDENT_AMBULATORY_CARE_PROVIDER_SITE_OTHER): Payer: 59 | Admitting: Medical

## 2015-06-27 VITALS — BP 124/72 | HR 96 | Temp 98.2°F | Wt 187.0 lb

## 2015-06-27 DIAGNOSIS — R1084 Generalized abdominal pain: Secondary | ICD-10-CM

## 2015-06-27 LAB — POCT URINALYSIS DIPSTICK
Bilirubin, UA: NEGATIVE
Blood, UA: NEGATIVE
Glucose, UA: NEGATIVE
Ketones, UA: NEGATIVE
LEUKOCYTES UA: NEGATIVE
NITRITE UA: NEGATIVE
PH UA: 6
PROTEIN UA: NEGATIVE
Spec Grav, UA: >=1.03
UROBILINOGEN UA: NEGATIVE

## 2015-06-27 NOTE — Progress Notes (Signed)
Subjective: Chief Complaint  Patient presents with  . Abdominal Pain    started 3 days ago. thinks it is from something she ate   Here for stomach pain, thought it was gas.   Has used some tea.   Having this for 3 days.   Pain is more generalized. Having nausea.   No vomiting.   2 times daily with diarrhea the last few days.  No fever, no blood in stool.   Has some back pain but not new.  No urinary c/o.  Using nexium daily in the morning.  Using no other medication.  Of note, she was seen here in August for routine diabetes and med check f/u, compliant with medication, and labs from that visit reviewed.   No other aggravating or relieving factors.  No other complaint.  Past Medical History  Diagnosis Date  . Hypertension   . Diabetes mellitus   . GERD (gastroesophageal reflux disease)   . Diverticulosis of colon   . Allergic rhinitis   . Obesity   . Cataract   . Helicobacter pylori gastritis   . HLD (hyperlipidemia)    ROS as in subjective   Objective: BP 124/72 mmHg  Pulse 96  Temp(Src) 98.2 F (36.8 C)  Wt 187 lb (84.823 kg)  Gen: wd, wn,nad Lungs clear Heart: RRR, normal s1, s2, no murmurs Abdomen:+bs,soft, mild generalized tenderness no mass, no guarding, no organomegaly Back: non tender   Assessment: Encounter Diagnosis  Name Primary?  . Generalized abdominal pain Yes    Plan: Discussed symptoms, exam findings.   I suspect gastritis vs viral gastroenteritis.   Discussed supportive care, hydrate well, can use OTC Pepto bismol, c/t nexium, and call or recheck if worse or not improving.   Patient Instructions   Encounter Diagnosis  Name Primary?  . Generalized abdominal pain Yes   Recommendations:  Your symptoms and exam suggest gastritis or possibly a viral stomach bug  I recommend for the next 3-5 days to avoid onions, beans, fried foods, big portions, spicy foods  I would eat small portions, drink plenty of water daily, eat bland things such as bananas,  rice, applesauce, toast, crackers, etc. Until symptoms improve  Continue Nexium daily  Use OTC Pepto Bismol as labeled to calm down the upset stomach  If fever, blood in stool, or 5+ diarrhea stools a day, then call back  If much worse call or come in Call the after hours line if needed over the weekend

## 2015-06-27 NOTE — Patient Instructions (Signed)
Encounter Diagnosis  Name Primary?  . Generalized abdominal pain Yes   Recommendations:  Your symptoms and exam suggest gastritis or possibly a viral stomach bug  I recommend for the next 3-5 days to avoid onions, beans, fried foods, big portions, spicy foods  I would eat small portions, drink plenty of water daily, eat bland things such as bananas, rice, applesauce, toast, crackers, etc. Until symptoms improve  Continue Nexium daily  Use OTC Pepto Bismol as labeled to calm down the upset stomach  If fever, blood in stool, or 5+ diarrhea stools a day, then call back  If much worse call or come in Call the after hours line if needed over the weekend

## 2015-08-08 ENCOUNTER — Ambulatory Visit: Payer: 59 | Admitting: Family Medicine

## 2015-08-12 ENCOUNTER — Ambulatory Visit (INDEPENDENT_AMBULATORY_CARE_PROVIDER_SITE_OTHER): Payer: 59 | Admitting: Family Medicine

## 2015-08-12 ENCOUNTER — Encounter: Payer: Self-pay | Admitting: Family Medicine

## 2015-08-12 VITALS — BP 128/84 | HR 72 | Temp 98.2°F | Ht 65.0 in | Wt 188.8 lb

## 2015-08-12 DIAGNOSIS — E669 Obesity, unspecified: Secondary | ICD-10-CM | POA: Diagnosis not present

## 2015-08-12 DIAGNOSIS — E118 Type 2 diabetes mellitus with unspecified complications: Secondary | ICD-10-CM

## 2015-08-12 DIAGNOSIS — E785 Hyperlipidemia, unspecified: Secondary | ICD-10-CM | POA: Diagnosis not present

## 2015-08-12 DIAGNOSIS — Z23 Encounter for immunization: Secondary | ICD-10-CM

## 2015-08-12 DIAGNOSIS — E1169 Type 2 diabetes mellitus with other specified complication: Secondary | ICD-10-CM

## 2015-08-12 DIAGNOSIS — I10 Essential (primary) hypertension: Secondary | ICD-10-CM | POA: Diagnosis not present

## 2015-08-12 DIAGNOSIS — E1159 Type 2 diabetes mellitus with other circulatory complications: Secondary | ICD-10-CM | POA: Diagnosis not present

## 2015-08-12 DIAGNOSIS — I152 Hypertension secondary to endocrine disorders: Secondary | ICD-10-CM

## 2015-08-12 NOTE — Progress Notes (Signed)
  Subjective:   [name] is an 64 y.o. female who presents for follow up of Type 2 diabetes mellitus.   Patient is checking home blood sugars.   Home blood sugar records: BGs have been labile ranging between 142 and 142 Current symptoms include: Has Fungus on both great nails. Patient is checking their feet daily. Foot concerns (callous, ulcer, wound, thickened nails, toenail fungus, skin fungus, hammer toe): Both great toe nails Last dilated eye exam Aug 2017.  Current treatments: Continued metformin which has been effective. Medication compliance: good  Current diet: in general, an "unhealthy" diet Current exercise: housecleaning Known diabetic complications: none   The following portions of the patient's history were reviewed and updated as appropriate: allergies, current medications, past family history, past medical history, past social history, past surgical history and problem list.  ROS as in subjective above    Objective:   alert and in no distress otherwise not examined. Hemoglobin A1c 7.4    Assessment:   Need for prophylactic vaccination against Streptococcus pneumoniae (pneumococcus) - Plan: Pneumococcal conjugate vaccine 13-valent  Controlled type 2 diabetes mellitus with complication, without long-term current use of insulin (HCC)  Hyperlipidemia associated with type 2 diabetes mellitus ([place])  Hypertension associated with diabetes ([place])  Obesity    Plan:   Diabetes Mellitus type 2: Education: Reviewed 'ABCs' of diabetes management (respective goals in parentheses):  A1C (<7), blood pressure (<130/80), and cholesterol (LDL <100)   Diabetes mellitus Type II, under fair control.   Compliance at present is estimated to be fair. Efforts to improve compliance (if necessary) will be directed at increased exercise.    Blood pressure: normal blood pressure .   An ACE/ARB is currently part of their treatment regimen.   Dyslipidemia under good control. .   A statin is  currently part of their treatment regimen.   Diabetes educator referral.    Follow up: 4 months

## 2015-10-16 ENCOUNTER — Encounter: Payer: Self-pay | Admitting: Family Medicine

## 2015-10-16 ENCOUNTER — Ambulatory Visit (INDEPENDENT_AMBULATORY_CARE_PROVIDER_SITE_OTHER): Payer: BLUE CROSS/BLUE SHIELD | Admitting: Family Medicine

## 2015-10-16 VITALS — BP 130/88 | HR 69 | Ht 65.0 in | Wt 183.0 lb

## 2015-10-16 DIAGNOSIS — I10 Essential (primary) hypertension: Secondary | ICD-10-CM | POA: Diagnosis not present

## 2015-10-16 DIAGNOSIS — E1169 Type 2 diabetes mellitus with other specified complication: Secondary | ICD-10-CM

## 2015-10-16 DIAGNOSIS — E669 Obesity, unspecified: Secondary | ICD-10-CM

## 2015-10-16 DIAGNOSIS — E785 Hyperlipidemia, unspecified: Secondary | ICD-10-CM | POA: Diagnosis not present

## 2015-10-16 DIAGNOSIS — E1159 Type 2 diabetes mellitus with other circulatory complications: Secondary | ICD-10-CM | POA: Diagnosis not present

## 2015-10-16 DIAGNOSIS — J320 Chronic maxillary sinusitis: Secondary | ICD-10-CM

## 2015-10-16 DIAGNOSIS — E118 Type 2 diabetes mellitus with unspecified complications: Secondary | ICD-10-CM | POA: Diagnosis not present

## 2015-10-16 DIAGNOSIS — I152 Hypertension secondary to endocrine disorders: Secondary | ICD-10-CM

## 2015-10-16 LAB — POCT GLYCOSYLATED HEMOGLOBIN (HGB A1C): HEMOGLOBIN A1C: 7.3

## 2015-10-16 MED ORDER — AMOXICILLIN-POT CLAVULANATE 875-125 MG PO TABS: 1.0000 | ORAL_TABLET | Freq: Two times a day (BID) | ORAL | Status: DC

## 2015-10-16 NOTE — Progress Notes (Signed)
   Subjective:    Patient ID: [name], female    DOB: 1950/09/21, 65 y.o.   MRN: [id number]  HPI He is here for a follow-up visit concerning a hospitalization that occurred when she was visiting in the [place] Communication was difficult due to language per but she states she was having difficulty with dizziness and did apparently pass out. She was seen in the hospital and Overnight. She says that the evaluation there including blood work and x-rays was negative. Presently she is not having any weakness, double vision, blurred vision, dizziness. She does complain of right cheek pain. Towards the end of the interview she then admitted she been having sinus congestion, postnasal drainage and coughing for the last 3 weeks. She has seen the dentist and apparently the dental exam was negative. She does have underlying diabetes and does occasionally check her blood sugars. That was not discussed. Weight was reviewed and is unchanged.   Review of Systems     Objective:   Physical Exam Alert and in no distress. Tympanic membranes and canals are normal. Pharyngeal area is normal. Neck is supple without adenopathy or thyromegaly. Mucosa is normal with tenderness over frontal and ethmoid sinuses.Cardiac exam shows a regular sinus rhythm without murmurs or gallops. Lungs are clear to auscultation. Hemoglobin A1c is 7.3       Assessment & Plan:  Maxillary sinusitis, unspecified chronicity - Plan: amoxicillin-clavulanate (AUGMENTIN) 875-125 MG tablet  Controlled diabetes mellitus type 2 with complications, unspecified long term insulin use status (HCC) - Plan: POCT glycosylated hemoglobin (Hb A1C)  Hyperlipidemia associated with type 2 diabetes mellitus ([place])  Hypertension associated with diabetes ([place])  Obesity Overall she seems to be doing much better. The dizziness could've been related to her underlying sinus disease.Augmentin was prescribed. She is to return here in roughly 4  months for recheck.

## 2015-10-31 LAB — HM DIABETES EYE EXAM

## 2015-12-03 ENCOUNTER — Telehealth: Payer: Self-pay | Admitting: Family Medicine

## 2015-12-03 ENCOUNTER — Other Ambulatory Visit: Payer: Self-pay | Admitting: Family Medicine

## 2015-12-03 NOTE — Telephone Encounter (Signed)
Med was already sent in

## 2015-12-03 NOTE — Telephone Encounter (Signed)
Rcvd refill request for Metformin 1000 mg #90

## 2016-01-17 ENCOUNTER — Telehealth: Payer: Self-pay | Admitting: Family Medicine

## 2016-01-18 MED ORDER — GLUCOSE BLOOD VI STRP: ORAL_STRIP | Status: DC

## 2016-01-18 MED ORDER — BAYER CONTOUR MONITOR DEVI: Status: DC

## 2016-01-18 NOTE — Telephone Encounter (Signed)
Insurance company was called & they will pay for [employer] & this was called into [employer]

## 2016-02-24 ENCOUNTER — Ambulatory Visit: Payer: BLUE CROSS/BLUE SHIELD | Admitting: Family Medicine

## 2016-02-28 ENCOUNTER — Ambulatory Visit (INDEPENDENT_AMBULATORY_CARE_PROVIDER_SITE_OTHER): Payer: BLUE CROSS/BLUE SHIELD | Admitting: Family Medicine

## 2016-02-28 ENCOUNTER — Encounter: Payer: Self-pay | Admitting: Family Medicine

## 2016-02-28 ENCOUNTER — Other Ambulatory Visit: Payer: Self-pay | Admitting: Internal Medicine

## 2016-02-28 VITALS — BP 126/80 | HR 77 | Ht 65.0 in | Wt 188.2 lb

## 2016-02-28 DIAGNOSIS — E1159 Type 2 diabetes mellitus with other circulatory complications: Secondary | ICD-10-CM

## 2016-02-28 DIAGNOSIS — E1136 Type 2 diabetes mellitus with diabetic cataract: Secondary | ICD-10-CM | POA: Diagnosis not present

## 2016-02-28 DIAGNOSIS — H409 Unspecified glaucoma: Secondary | ICD-10-CM | POA: Diagnosis not present

## 2016-02-28 DIAGNOSIS — I152 Hypertension secondary to endocrine disorders: Secondary | ICD-10-CM

## 2016-02-28 DIAGNOSIS — I10 Essential (primary) hypertension: Secondary | ICD-10-CM

## 2016-02-28 DIAGNOSIS — E669 Obesity, unspecified: Secondary | ICD-10-CM | POA: Diagnosis not present

## 2016-02-28 DIAGNOSIS — E118 Type 2 diabetes mellitus with unspecified complications: Secondary | ICD-10-CM | POA: Diagnosis not present

## 2016-02-28 DIAGNOSIS — E1169 Type 2 diabetes mellitus with other specified complication: Secondary | ICD-10-CM | POA: Diagnosis not present

## 2016-02-28 DIAGNOSIS — E785 Hyperlipidemia, unspecified: Secondary | ICD-10-CM | POA: Diagnosis not present

## 2016-02-28 LAB — POCT GLYCOSYLATED HEMOGLOBIN (HGB A1C): Hemoglobin A1C: 7.6

## 2016-02-28 MED ORDER — HYDROCORTISONE ACE-PRAMOXINE 2.5-1 % RE CREA: 1.0000 "application " | TOPICAL_CREAM | Freq: Two times a day (BID) | RECTAL | Status: DC | PRN

## 2016-02-28 NOTE — Progress Notes (Signed)
  Subjective:    Patient ID: [name], female    DOB: 10-Jun-1951, 65 y.o.   MRN: [id number]  [name] is a 65 y.o. female who presents for follow-up of Type 2 diabetes mellitus.  Patient is checking home blood sugars.   Home blood sugar records: 95 to 157 How often is blood sugars being checked: BID.Her diet is unchanged No problems Daily foot checks: yes   Any foot concerns: none Last eye exam: 2/17 next appointment JulyAnd she does have underlying glaucoma as well as Atarax and is followed regularly for this. Exercise: work She continues on eyedrops as mentioned above. She is also taking her lisinopril as well as simvastatin and having no difficulty with them. The following portions of the patient's history were reviewed and updated as appropriate: allergies, current medications, past medical history, past social history and problem list.  ROS as in subjective above.     Objective:    Physical Exam Alert and in no distress otherwise not examined.  There were no vitals taken for this visit.  Lab Review Diabetic Labs Latest Ref Rng 10/16/2015 04/08/2015 12/06/2014 08/01/2014 01/01/2014  HbA1c - 7.3 7.5 7.2 7.7 7.6%  Chol 125 - 200 mg/dL - 177 - - 186  HDL >=46 mg/dL - 55 - - 55  Calc LDL <130 mg/dL - 102 - - 107(H)  Triglycerides <150 mg/dL - 100 - - 120  Creatinine 0.50 - 0.99 mg/dL - 0.54 - - 0.59   BP/Weight 10/16/2015 08/12/2015 06/27/2015 04/08/2015 [id number]  Systolic BP [id number]  Diastolic BP 88 84 72 88 88  Wt. (Lbs) 183 188.8 187 188 185  BMI 30.45 31.42 31.12 31.28 30.79   Foot/eye exam completion dates 04/08/2015  Foot Form Completion Done  A1c 7.6  [name]  reports that she has never smoked. She has never used smokeless tobacco. She reports that she does not drink alcohol or use illicit drugs.     Assessment & Plan:    Hypertension associated with diabetes ([place])  Obesity  Controlled diabetes mellitus type 2 with complications, unspecified long  term insulin use status ([place])  Cataract associated with type 2 diabetes mellitus ([place])  Hyperlipidemia associated with type 2 diabetes mellitus (HCC)  Glaucoma (increased eye pressure)    1. Rx changes: none 2. Education: Reviewed 'ABCs' of diabetes management (respective goals in parentheses):  A1C (<7), blood pressure (<130/80), and cholesterol (LDL <100). 3. Compliance at present is estimated to be good. Efforts to improve compliance (if necessary) will be directed at increased exercise. 4. Follow up: 4 months  5. I again stressed the need for her to make further dietary and exercise changes however she seems to be holding her own. She will continue on medications listed in the chart.

## 2016-04-28 ENCOUNTER — Telehealth: Payer: Self-pay | Admitting: Internal Medicine

## 2016-04-28 DIAGNOSIS — I10 Essential (primary) hypertension: Principal | ICD-10-CM

## 2016-04-28 DIAGNOSIS — I152 Hypertension secondary to endocrine disorders: Secondary | ICD-10-CM

## 2016-04-28 DIAGNOSIS — E1159 Type 2 diabetes mellitus with other circulatory complications: Secondary | ICD-10-CM

## 2016-04-28 MED ORDER — LISINOPRIL-HYDROCHLOROTHIAZIDE 20-12.5 MG PO TABS: 1.0000 | ORAL_TABLET | Freq: Every day | ORAL | 0 refills | Status: DC

## 2016-04-28 NOTE — Telephone Encounter (Signed)
Pt called and states she needs a refill on her bp meds. Pt is going out of the country and doesn't know when she will be back. She was aware that she is in need to come in for a med check or physical. We will refill it as she is leaving the country. Filled it for 90 day supply

## 2016-05-18 ENCOUNTER — Encounter: Payer: Self-pay | Admitting: Internal Medicine

## 2016-06-09 ENCOUNTER — Encounter: Payer: Self-pay | Admitting: Internal Medicine

## 2016-06-09 ENCOUNTER — Other Ambulatory Visit: Payer: Self-pay | Admitting: Family Medicine

## 2016-06-09 DIAGNOSIS — Z1231 Encounter for screening mammogram for malignant neoplasm of breast: Secondary | ICD-10-CM

## 2016-06-18 ENCOUNTER — Ambulatory Visit
Admission: RE | Admit: 2016-06-18 | Discharge: 2016-06-18 | Disposition: A | Discharge status: Home / Self Care | Payer: BLUE CROSS/BLUE SHIELD | Source: Ambulatory Visit | Attending: Family Medicine | Admitting: Family Medicine

## 2016-06-18 DIAGNOSIS — Z1231 Encounter for screening mammogram for malignant neoplasm of breast: Secondary | ICD-10-CM | POA: Diagnosis not present

## 2016-06-29 ENCOUNTER — Encounter: Payer: Self-pay | Admitting: Family Medicine

## 2016-06-29 ENCOUNTER — Ambulatory Visit (INDEPENDENT_AMBULATORY_CARE_PROVIDER_SITE_OTHER): Payer: BLUE CROSS/BLUE SHIELD | Admitting: Family Medicine

## 2016-06-29 VITALS — BP 130/90 | HR 78 | Ht 64.5 in | Wt 183.8 lb

## 2016-06-29 DIAGNOSIS — E785 Hyperlipidemia, unspecified: Secondary | ICD-10-CM

## 2016-06-29 DIAGNOSIS — Z0000 Encounter for general adult medical examination without abnormal findings: Secondary | ICD-10-CM

## 2016-06-29 DIAGNOSIS — I10 Essential (primary) hypertension: Secondary | ICD-10-CM | POA: Diagnosis not present

## 2016-06-29 DIAGNOSIS — J301 Allergic rhinitis due to pollen: Secondary | ICD-10-CM | POA: Diagnosis not present

## 2016-06-29 DIAGNOSIS — E1169 Type 2 diabetes mellitus with other specified complication: Secondary | ICD-10-CM

## 2016-06-29 DIAGNOSIS — N3281 Overactive bladder: Secondary | ICD-10-CM

## 2016-06-29 DIAGNOSIS — E1159 Type 2 diabetes mellitus with other circulatory complications: Secondary | ICD-10-CM

## 2016-06-29 DIAGNOSIS — K219 Gastro-esophageal reflux disease without esophagitis: Secondary | ICD-10-CM

## 2016-06-29 DIAGNOSIS — E118 Type 2 diabetes mellitus with unspecified complications: Secondary | ICD-10-CM

## 2016-06-29 DIAGNOSIS — H409 Unspecified glaucoma: Secondary | ICD-10-CM | POA: Diagnosis not present

## 2016-06-29 DIAGNOSIS — Z23 Encounter for immunization: Secondary | ICD-10-CM | POA: Diagnosis not present

## 2016-06-29 DIAGNOSIS — I152 Hypertension secondary to endocrine disorders: Secondary | ICD-10-CM

## 2016-06-29 DIAGNOSIS — E1136 Type 2 diabetes mellitus with diabetic cataract: Secondary | ICD-10-CM

## 2016-06-29 LAB — POCT URINALYSIS DIPSTICK
Bilirubin, UA: NEGATIVE
Blood, UA: NEGATIVE
Glucose, UA: NEGATIVE
Ketones, UA: NEGATIVE
Leukocytes, UA: NEGATIVE
NITRITE UA: NEGATIVE
PH UA: 7
Protein, UA: NEGATIVE
SPEC GRAV UA: 1.02
UROBILINOGEN UA: NEGATIVE

## 2016-06-29 LAB — CBC WITH DIFFERENTIAL/PLATELET
BASOS ABS: 56 {cells}/uL (ref 0–200)
Basophils Relative: 1 %
EOS ABS: 168 {cells}/uL (ref 15–500)
EOS PCT: 3 %
HEMATOCRIT: 36.4 % (ref 35.0–45.0)
HEMOGLOBIN: 12.1 g/dL (ref 11.7–15.5)
LYMPHS ABS: 2408 {cells}/uL (ref 850–3900)
Lymphocytes Relative: 43 %
MCH: 28.1 pg (ref 27.0–33.0)
MCHC: 33.2 g/dL (ref 32.0–36.0)
MCV: 84.7 fL (ref 80.0–100.0)
MONO ABS: 448 {cells}/uL (ref 200–950)
MPV: 8.8 fL (ref 7.5–12.5)
Monocytes Relative: 8 %
NEUTROS PCT: 45 %
Neutro Abs: 2520 cells/uL (ref 1500–7800)
Platelets: 295 10*3/uL (ref 140–400)
RBC: 4.3 MIL/uL (ref 3.80–5.10)
RDW: 14.8 % (ref 11.0–15.0)
WBC: 5.6 10*3/uL (ref 4.0–10.5)

## 2016-06-29 LAB — POCT UA - MICROALBUMIN
Albumin/Creatinine Ratio, Urine, POC: <7.6
Creatinine, POC: 65.4 mg/dL
Microalbumin Ur, POC: <5 mg/L

## 2016-06-29 LAB — POCT GLYCOSYLATED HEMOGLOBIN (HGB A1C): HEMOGLOBIN A1C: 7.5

## 2016-06-29 MED ORDER — LISINOPRIL-HYDROCHLOROTHIAZIDE 20-12.5 MG PO TABS: 1.0000 | ORAL_TABLET | Freq: Every day | ORAL | 3 refills | Status: DC

## 2016-06-29 MED ORDER — SIMVASTATIN 20 MG PO TABS: 20.0000 mg | ORAL_TABLET | Freq: Every day | ORAL | 3 refills | Status: DC

## 2016-06-29 MED ORDER — METFORMIN HCL 1000 MG PO TABS: ORAL_TABLET | ORAL | 1 refills | Status: DC

## 2016-06-29 NOTE — Patient Instructions (Addendum)
Use the Nexium as needed for your indigestion. Call me back and let me know how the medicine for your bladder is working

## 2016-06-29 NOTE — Progress Notes (Signed)
Subjective:    Patient ID: [name], female    DOB: [birth year], 65 y.o.   MRN: [id number]  HPI She is here for complete examination. She does have an interpreter present, Marlene [place] ([place] Does have underlying diabetes and does check her blood sugars. They usually run in the 120-130 range. Her work keeps her busy. She does see her ophthalmologist for eye exams, she also has difficulty with glaucoma as well as cataracts. She does check her feet regularly. Her diet is unchanged. She does not smoke or drink. She is scheduled for colonoscopy in November. She does use Nexium for her reflux symptoms with good results. She is not having difficulty with tension headaches at the present time. She is having some difficulty with overactive bladder symptoms with urgency and frequency but apparently has not had any incontinence issues as yet. Her allergies are under good control.   Review of Systems  All other systems reviewed and are negative.      Objective:   Physical Exam BP 130/90   Pulse 78   Ht 5' 4.5" (1.638 m)   Wt 183 lb 12.8 oz (83.4 kg)   BMI 31.06 kg/m   General Appearance:    Alert, cooperative, no distress, appears stated age  Head:    Normocephalic, without obvious abnormality, atraumatic  Eyes:    PERRL, conjunctiva/corneas clear, EOM's intact, fundi    benign  Ears:    Normal TM's and external ear canals  Nose:   Nares normal, mucosa normal, no drainage or sinus   tenderness  Throat:   Lips, mucosa, and tongue normal; teeth and gums normal  Neck:   Supple, no lymphadenopathy;  thyroid:  no   enlargement/tenderness/nodules; no carotid   bruit or JVD     Lungs:     Clear to auscultation bilaterally without wheezes, rales or     ronchi; respirations unlabored      Heart:    Regular rate and rhythm, S1 and S2 normal, no murmur, rub   or gallop  Breast Exam:    Deferred to GYN  Abdomen:     Soft, non-tender, nondistended, normoactive bowel sounds,    no masses, no  hepatosplenomegaly  Genitalia:    Deferred to GYN     Extremities:   No clubbing, cyanosis or edema  Pulses:   2+ and symmetric all extremities  Skin:   Skin color, texture, turgor normal, no rashes or lesions  Lymph nodes:   Cervical, supraclavicular, and axillary nodes normal  Neurologic:   CNII-XII intact, normal strength, sensation and gait; reflexes 2+ and symmetric throughout          Psych:   Normal mood, affect, hygiene and grooming.    A1c is 7.5.      Assessment & Plan:  Need for prophylactic vaccination against Streptococcus pneumoniae (pneumococcus) - Plan: Tdap vaccine greater than or equal to 7yo IM  Need for prophylactic vaccination and inoculation against influenza - Plan: Flu Vaccine QUAD 36+ mos IM  Acute seasonal allergic rhinitis due to pollen  Hypertension associated with diabetes ([place]) - Plan: lisinopril-hydrochlorothiazide (PRINZIDE,ZESTORETIC) 20-12.5 MG tablet  Gastroesophageal reflux disease without esophagitis - Plan: CBC with Differential/Platelet, Comprehensive metabolic panel  Controlled diabetes mellitus type 2 with complications, unspecified long term insulin use status ([place]) - Plan: metFORMIN (GLUCOPHAGE) 1000 MG tablet  Cataract associated with type 2 diabetes mellitus ([place]) - Plan: HgB A1c, POCT UA - Microalbumin  Hyperlipidemia associated with type 2 diabetes mellitus ([place]) -  Plan: Lipid panel, simvastatin (ZOCOR) 20 MG tablet  OAB (overactive bladder) - Plan: POCT Urinalysis Dipstick  Glaucoma of both eyes, unspecified glaucoma type Her immunizations were updated. I did give her a sample of Myrbetriq 25 mg per she is to call me at the end of the week and let me know how she is doing on that. She will continue on her other medications. Did recommend using Nexium on an as-needed basis. Overall she is doing fairly well taking care of herself.

## 2016-06-30 LAB — COMPREHENSIVE METABOLIC PANEL
ALBUMIN: 3.7 g/dL (ref 3.6–5.1)
ALT: 15 U/L (ref 6–29)
AST: 14 U/L (ref 10–35)
Alkaline Phosphatase: 78 U/L (ref 33–130)
BUN: 13 mg/dL (ref 7–25)
CALCIUM: 9.5 mg/dL (ref 8.6–10.4)
CHLORIDE: 102 mmol/L (ref 98–110)
CO2: 29 mmol/L (ref 20–31)
Creat: 0.69 mg/dL (ref 0.50–0.99)
Glucose, Bld: 141 mg/dL — ABNORMAL HIGH (ref 65–99)
POTASSIUM: 4.1 mmol/L (ref 3.5–5.3)
Sodium: 139 mmol/L (ref 135–146)
TOTAL PROTEIN: 7 g/dL (ref 6.1–8.1)
Total Bilirubin: 0.5 mg/dL (ref 0.2–1.2)

## 2016-06-30 LAB — LIPID PANEL
CHOLESTEROL: 169 mg/dL (ref 125–200)
HDL: 51 mg/dL (ref >=46)
LDL CALC: 93 mg/dL (ref <130)
TRIGLYCERIDES: 127 mg/dL (ref <150)
Total CHOL/HDL Ratio: 3.3 Ratio (ref <=5.0)
VLDL: 25 mg/dL (ref <30)

## 2016-07-14 ENCOUNTER — Other Ambulatory Visit: Payer: Self-pay

## 2016-07-14 ENCOUNTER — Telehealth: Payer: Self-pay

## 2016-07-14 MED ORDER — BAYER CONTOUR MONITOR DEVI: 0 refills | Status: AC

## 2016-07-14 MED ORDER — GLUCOSE BLOOD VI STRP: ORAL_STRIP | 3 refills | Status: DC

## 2016-07-14 MED ORDER — LANCETS THIN MISC: 3 refills | Status: DC

## 2016-07-14 NOTE — Telephone Encounter (Signed)
Sent in

## 2016-07-14 NOTE — Telephone Encounter (Signed)
Pt requesting a new meter to [facility] contour next machine and test strips.

## 2016-07-20 ENCOUNTER — Ambulatory Visit (AMBULATORY_SURGERY_CENTER): Payer: Self-pay | Admitting: *Deleted

## 2016-07-20 VITALS — Ht 65.0 in | Wt 186.0 lb

## 2016-07-20 DIAGNOSIS — Z1211 Encounter for screening for malignant neoplasm of colon: Secondary | ICD-10-CM

## 2016-07-20 NOTE — Progress Notes (Signed)
No egg or soy allergy known to patient  No issues with past sedation with any surgeries  or procedures, no intubation problems  No diet pills per patient No home 02 use per patient  No blood thinners per patient  Pt denies issues with constipation but has occ constipation but not chronic  No A fib or A flutter   emmi declined  viseo interpreter used in [place] today

## 2016-07-21 ENCOUNTER — Encounter: Payer: Self-pay | Admitting: Internal Medicine

## 2016-08-03 ENCOUNTER — Ambulatory Visit (AMBULATORY_SURGERY_CENTER): Payer: BLUE CROSS/BLUE SHIELD | Admitting: Internal Medicine

## 2016-08-03 ENCOUNTER — Encounter: Payer: Self-pay | Admitting: Internal Medicine

## 2016-08-03 VITALS — BP 110/77 | HR 60 | Temp 96.4°F | Resp 14 | Ht 65.0 in | Wt 186.0 lb

## 2016-08-03 DIAGNOSIS — D124 Benign neoplasm of descending colon: Secondary | ICD-10-CM | POA: Diagnosis not present

## 2016-08-03 DIAGNOSIS — Z1212 Encounter for screening for malignant neoplasm of rectum: Secondary | ICD-10-CM

## 2016-08-03 DIAGNOSIS — Z1211 Encounter for screening for malignant neoplasm of colon: Secondary | ICD-10-CM | POA: Diagnosis not present

## 2016-08-03 MED ORDER — SODIUM CHLORIDE 0.9 % IV SOLN
500.0000 mL | INTRAVENOUS | Status: DC
Start: 2016-08-03 — End: 2019-07-31

## 2016-08-03 NOTE — Progress Notes (Signed)
Called to room to assist during endoscopic procedure.  Patient ID and intended procedure confirmed with present staff. Received instructions for my participation in the procedure from the performing physician.  

## 2016-08-03 NOTE — Patient Instructions (Addendum)
I found and removed one small polyp. I will let you know pathology results and when to have another routine colonoscopy by mail.  You still have diverticulosis - thickened muscle rings and pouches in the colon wall. Please read the handout about this condition.  I appreciate the opportunity to care for you. Gatha Mayer, MD, [facility]  Discharge instructions given. Handouts on polyps and diverticulosis. Resume previous medications. YOU HAD AN ENDOSCOPIC PROCEDURE TODAY AT [facility]:   Refer to the procedure report that was given to you for any specific questions about what was found during the examination.  If the procedure report does not answer your questions, please call your gastroenterologist to clarify.  If you requested that your care partner not be given the details of your procedure findings, then the procedure report has been included in a sealed envelope for you to review at your convenience later.  YOU SHOULD EXPECT: Some feelings of bloating in the abdomen. Passage of more gas than usual.  Walking can help get rid of the air that was put into your GI tract during the procedure and reduce the bloating. If you had a lower endoscopy (such as a colonoscopy or flexible sigmoidoscopy) you may notice spotting of blood in your stool or on the toilet paper. If you underwent a bowel prep for your procedure, you may not have a normal bowel movement for a few days.  Please Note:  You might notice some irritation and congestion in your nose or some drainage.  This is from the oxygen used during your procedure.  There is no need for concern and it should clear up in a day or so.  SYMPTOMS TO REPORT IMMEDIATELY:   Following lower endoscopy (colonoscopy or flexible sigmoidoscopy):  Excessive amounts of blood in the stool  Significant tenderness or worsening of abdominal pains  Swelling of the abdomen that is new, acute  Fever of 100F or higher   For urgent or  emergent issues, a gastroenterologist can be reached at any hour by calling [phone].   DIET:  We do recommend a small meal at first, but then you may proceed to your regular diet.  Drink plenty of fluids but you should avoid alcoholic beverages for 24 hours.  ACTIVITY:  You should plan to take it easy for the rest of today and you should NOT DRIVE or use heavy machinery until tomorrow (because of the sedation medicines used during the test).    FOLLOW UP: Our staff will call the number listed on your records the next business day following your procedure to check on you and address any questions or concerns that you may have regarding the information given to you following your procedure. If we do not reach you, we will leave a message.  However, if you are feeling well and you are not experiencing any problems, there is no need to return our call.  We will assume that you have returned to your regular daily activities without incident.  If any biopsies were taken you will be contacted by phone or by letter within the next 1-3 weeks.  Please call us at [phone] if you have not heard about the biopsies in 3 weeks.    SIGNATURES/CONFIDENTIALITY: You and/or your care partner have signed paperwork which will be entered into your electronic medical record.  These signatures attest to the fact that that the information above on your After Visit Summary has been reviewed and is understood.  Full responsibility of the confidentiality of this discharge information lies with you and/or your care-partner.

## 2016-08-03 NOTE — Op Note (Signed)
[place] Patient Name: [name] Procedure Date: 08/03/2016 8:11 AM MRN: [id number] Endoscopist: Gatha Mayer , MD Age: 65 Referring MD:  Date of Birth: February 05, 1951 Gender: Female Account #: [id number] Procedure:                Colonoscopy Indications:              Screening for colorectal malignant neoplasm, Last                            colonoscopy: 2007 Medicines:                Monitored Anesthesia Care, Propofol per Anesthesia Procedure:                Pre-Anesthesia Assessment:                           - Prior to the procedure, a History and Physical                            was performed, and patient medications and                            allergies were reviewed. The patient's tolerance of                            previous anesthesia was also reviewed. The risks                            and benefits of the procedure and the sedation                            options and risks were discussed with the patient.                            All questions were answered, and informed consent                            was obtained. Prior Anticoagulants: The patient has                            taken no previous anticoagulant or antiplatelet                            agents. ASA Grade Assessment: II - A patient with                            mild systemic disease. After reviewing the risks                            and benefits, the patient was deemed in                            satisfactory condition to undergo the procedure.  After obtaining informed consent, the colonoscope                            was passed under direct vision. Throughout the                            procedure, the patient's blood pressure, pulse, and                            oxygen saturations were monitored continuously. The                            Model CF-HQ190L [id number]) scope was introduced                            through the anus  and advanced to the the cecum,                            identified by appendiceal orifice and ileocecal                            valve. The colonoscopy was performed without                            difficulty. The patient tolerated the procedure                            well. The quality of the bowel preparation was                            excellent. The ileocecal valve, appendiceal                            orifice, and rectum were photographed. The bowel                            preparation used was Miralax. Scope In: 8:27:43 AM Scope Out: 8:42:34 AM Scope Withdrawal Time: 0 hours 10 minutes 58 seconds  Total Procedure Duration: 0 hours 14 minutes 51 seconds  Findings:                 The perianal and digital rectal examinations were                            normal.                           A 6 mm polyp was found in the descending colon. The                            polyp was sessile. The polyp was removed with a                            cold snare. Resection and retrieval were complete.  Verification of patient identification for the                            specimen was done. Estimated blood loss was minimal.                           Many small and large-mouthed diverticula were found                            in the entire colon. There was no evidence of                            diverticular bleeding.                           The exam was otherwise without abnormality on                            direct and retroflexion views. Complications:            No immediate complications. Estimated Blood Loss:     Estimated blood loss was minimal. Impression:               - One 6 mm polyp in the descending colon, removed                            with a cold snare. Resected and retrieved.                           - Severe diverticulosis in the sigmoid and mild in                            the remainder of entire examined colon.  There was                            no evidence of diverticular bleeding.                           - The examination was otherwise normal on direct                            and retroflexion views. Recommendation:           - Resume previous diet.                           - Patient has a contact number available for                            emergencies. The signs and symptoms of potential                            delayed complications were discussed with the                            patient.  Return to normal activities tomorrow.                            Written discharge instructions were provided to the                            patient.                           - Continue present medications.                           - Repeat colonoscopy is recommended. The                            colonoscopy date will be determined after pathology                            results from today's exam become available for                            review. Gatha Mayer, MD 08/03/2016 8:50:15 AM This report has been signed electronically.

## 2016-08-03 NOTE — Progress Notes (Signed)
Cresenciano Genre, interpreter for pt.  Pt speaks spanish. maw

## 2016-08-03 NOTE — Progress Notes (Signed)
  [facility] Post-op Note  Patient: [name]  Procedure(s) Performed: colonoscopy  Patient Location: [facility] - Recovery Area  Anesthesia Type: Deep Sedation/Propofol  Level of Consciousness: awake, oriented and patient cooperative  Airway and Oxygen Therapy: Patient Spontanous Breathing  Post-op Pain: none  Post-op Assessment:  Post-op Vital signs reviewed, Patient's Cardiovascular Status Stable, Respiratory Function Stable, Patent Airway, No signs of Nausea or vomiting and Pain level controlled  Post-op Vital Signs: Reviewed and stable  Complications: No apparent anesthesia complications  Dia Jefferys E 8:48 AM

## 2016-08-04 ENCOUNTER — Telehealth: Payer: Self-pay | Admitting: *Deleted

## 2016-08-04 NOTE — Telephone Encounter (Signed)
  Follow up Call-  Call back number 08/03/2016  Post procedure Call Back phone  # [phone] cell  Permission to leave phone message Yes  Some recent data might be hidden     Patient questions:  Do you have a fever, pain , or abdominal swelling? No. Pain Score  0 *  Have you tolerated food without any problems? Yes.    Have you been able to return to your normal activities? Yes.    Do you have any questions about your discharge instructions: Diet   No. Medications  No. Follow up visit  No.  Do you have questions or concerns about your Care? No.  Actions: * If pain score is 4 or above: No action needed, pain <4.

## 2016-08-05 DIAGNOSIS — Z961 Presence of intraocular lens: Secondary | ICD-10-CM | POA: Diagnosis not present

## 2016-08-05 DIAGNOSIS — H401134 Primary open-angle glaucoma, bilateral, indeterminate stage: Secondary | ICD-10-CM | POA: Diagnosis not present

## 2016-08-06 ENCOUNTER — Encounter: Payer: Self-pay | Admitting: Internal Medicine

## 2016-08-06 DIAGNOSIS — Z860101 Personal history of adenomatous and serrated colon polyps: Secondary | ICD-10-CM

## 2016-08-06 DIAGNOSIS — Z86010 Personal history of colonic polyps: Secondary | ICD-10-CM | POA: Insufficient documentation

## 2016-08-06 HISTORY — DX: Personal history of colonic polyps: Z86.010

## 2016-08-06 HISTORY — DX: Personal history of adenomatous and serrated colon polyps: Z86.0101

## 2016-08-06 NOTE — Progress Notes (Signed)
6 mm adenoma Recall 2022

## 2016-11-30 ENCOUNTER — Encounter: Payer: Self-pay | Admitting: Family Medicine

## 2016-11-30 ENCOUNTER — Ambulatory Visit (INDEPENDENT_AMBULATORY_CARE_PROVIDER_SITE_OTHER): Payer: Self-pay | Admitting: Family Medicine

## 2016-11-30 VITALS — BP 140/82 | HR 86 | Temp 98.2°F | Wt 183.8 lb

## 2016-11-30 DIAGNOSIS — R202 Paresthesia of skin: Secondary | ICD-10-CM

## 2016-11-30 DIAGNOSIS — R200 Anesthesia of skin: Secondary | ICD-10-CM

## 2016-11-30 DIAGNOSIS — M5489 Other dorsalgia: Secondary | ICD-10-CM

## 2016-11-30 LAB — POCT URINALYSIS DIPSTICK
Bilirubin, UA: NEGATIVE
Blood, UA: NEGATIVE
GLUCOSE UA: NEGATIVE
Ketones, UA: NEGATIVE
Leukocytes, UA: NEGATIVE
NITRITE UA: NEGATIVE
PROTEIN UA: NEGATIVE
Spec Grav, UA: 1.03 (ref 1.030–1.035)
UROBILINOGEN UA: NEGATIVE (ref ?–2.0)
pH, UA: 6 (ref 5.0–8.0)

## 2016-11-30 LAB — COMPREHENSIVE METABOLIC PANEL
ALT: 18 U/L (ref 6–29)
AST: 15 U/L (ref 10–35)
Albumin: 4.1 g/dL (ref 3.6–5.1)
Alkaline Phosphatase: 79 U/L (ref 33–130)
BUN: 10 mg/dL (ref 7–25)
CALCIUM: 9.4 mg/dL (ref 8.6–10.4)
CO2: 25 mmol/L (ref 20–31)
CREATININE: 0.61 mg/dL (ref 0.50–0.99)
Chloride: 100 mmol/L (ref 98–110)
GLUCOSE: 108 mg/dL — AB (ref 65–99)
Potassium: 3.8 mmol/L (ref 3.5–5.3)
SODIUM: 137 mmol/L (ref 135–146)
Total Bilirubin: 0.7 mg/dL (ref 0.2–1.2)
Total Protein: 7.4 g/dL (ref 6.1–8.1)

## 2016-11-30 LAB — CBC WITH DIFFERENTIAL/PLATELET
BASOS PCT: 1 %
Basophils Absolute: 62 cells/uL (ref 0–200)
Eosinophils Absolute: 806 cells/uL — ABNORMAL HIGH (ref 15–500)
Eosinophils Relative: 13 %
HEMATOCRIT: 37.6 % (ref 35.0–45.0)
HEMOGLOBIN: 12.6 g/dL (ref 11.7–15.5)
LYMPHS ABS: 1860 {cells}/uL (ref 850–3900)
LYMPHS PCT: 30 %
MCH: 29.3 pg (ref 27.0–33.0)
MCHC: 33.5 g/dL (ref 32.0–36.0)
MCV: 87.4 fL (ref 80.0–100.0)
MONO ABS: 806 {cells}/uL (ref 200–950)
MPV: 8.9 fL (ref 7.5–12.5)
Monocytes Relative: 13 %
Neutro Abs: 2666 cells/uL (ref 1500–7800)
Neutrophils Relative %: 43 %
Platelets: 277 10*3/uL (ref 140–400)
RBC: 4.3 MIL/uL (ref 3.80–5.10)
RDW: 14.8 % (ref 11.0–15.0)
WBC: 6.2 10*3/uL (ref 4.0–10.5)

## 2016-11-30 LAB — TSH: TSH: 0.83 mIU/L

## 2016-11-30 LAB — VITAMIN B12: Vitamin B-12: 413 pg/mL (ref 200–1100)

## 2016-11-30 NOTE — Patient Instructions (Signed)
Try taking naproxen and see if this helps. We will call you with your lab results.

## 2016-11-30 NOTE — Progress Notes (Signed)
   Subjective:    Patient ID: [name], female    DOB: [birth year], 66 y.o.   MRN: [id number]  HPI Chief Complaint  Patient presents with  . back pain    back pain down to left shoulder. numberness and pain for 2 weeks. pain when lying on it. no chest pain. no shortness of breath   She is here a 66 year old female with a history of DM2, HTN, hyperlipidemia, obesity who is here with complaints of a 2 week history of numbness and tingling to her left arm.  Denies pain or weakness. No other areas of involvement. Denies chest pain, palpitations, shortness of breath, abdominal pain, nausea, vomiting, diarrhea. No LE edema.  States she had right low back pain but this has resolved.   Denies fever, chills, headache, dizziness, fatigue.   Reviewed allergies, medications, past medical, surgical,  and social history.   Review of Systems Pertinent positives and negatives in the history of present illness.     Objective:   Physical Exam  Constitutional: She is oriented to person, place, and time. She appears well-developed and well-nourished. No distress.  HENT:  Mouth/Throat: Uvula is midline, oropharynx is clear and moist and mucous membranes are normal.  Eyes: Conjunctivae, EOM and lids are normal. Pupils are equal, round, and reactive to light.  Neck: Normal range of motion. Neck supple. No JVD present.  Cardiovascular: Normal rate, regular rhythm, normal heart sounds and normal pulses.   No LE edema  Pulmonary/Chest: Effort normal and breath sounds normal.  Musculoskeletal:       Left shoulder: Normal.       Left elbow: Normal.       Left wrist: Normal.       Cervical back: Normal.  Lymphadenopathy:    She has no cervical adenopathy.  Neurological: She is alert and oriented to person, place, and time. She has normal strength and normal reflexes. No cranial nerve deficit or sensory deficit. Coordination and gait normal.  Equal grip strength, no pronator drift.  Left shoulder exam  is unremarkable.   Skin: Skin is warm and dry. No rash noted. No pallor.  Psychiatric: She has a normal mood and affect. Her speech is normal.   BP 140/82   Pulse 86   Temp 98.2 F (36.8 C) (Oral)   Wt 183 lb 12.8 oz (83.4 kg)   SpO2 98%   BMI 30.59 kg/m       Assessment & Plan:  Numbness and tingling in left arm - Plan: CBC with Differential/Platelet, Comprehensive metabolic panel, RPR, TSH, Vitamin B12, EKG 12-Lead  Other back pain, unspecified chronicity - Plan: Urinalysis Dipstick  Discussed that her neurological exam in normal. No sign of focal deficit or weakness.  Urinalysis dipstick: negative ECG: no acute changes, read by Dr. Redmond School and myself.  Plan to check labs and look for underlying etiology. The patient does not currently have insurance and would like to hold off on a cervical XR at this point to see if her symptoms improve.  She will follow up if symptoms worsen or if any new symptoms.  Follow up pending labs.

## 2016-12-01 ENCOUNTER — Encounter: Payer: Self-pay | Admitting: Family Medicine

## 2016-12-01 DIAGNOSIS — R898 Other abnormal findings in specimens from other organs, systems and tissues: Secondary | ICD-10-CM | POA: Insufficient documentation

## 2016-12-01 DIAGNOSIS — D721 Eosinophilia: Secondary | ICD-10-CM

## 2016-12-01 LAB — RPR

## 2016-12-02 ENCOUNTER — Other Ambulatory Visit: Payer: Self-pay | Admitting: Family Medicine

## 2016-12-02 DIAGNOSIS — R898 Other abnormal findings in specimens from other organs, systems and tissues: Secondary | ICD-10-CM

## 2016-12-02 DIAGNOSIS — D721 Eosinophilia: Principal | ICD-10-CM

## 2016-12-30 ENCOUNTER — Encounter: Payer: Self-pay | Admitting: Medical

## 2016-12-30 ENCOUNTER — Ambulatory Visit (INDEPENDENT_AMBULATORY_CARE_PROVIDER_SITE_OTHER): Payer: Self-pay | Admitting: Medical

## 2016-12-30 VITALS — BP 136/88 | HR 83 | Temp 98.1°F | Wt 186.6 lb

## 2016-12-30 DIAGNOSIS — R059 Cough, unspecified: Secondary | ICD-10-CM

## 2016-12-30 DIAGNOSIS — J301 Allergic rhinitis due to pollen: Secondary | ICD-10-CM

## 2016-12-30 DIAGNOSIS — R062 Wheezing: Secondary | ICD-10-CM

## 2016-12-30 DIAGNOSIS — R05 Cough: Secondary | ICD-10-CM

## 2016-12-30 MED ORDER — PREDNISONE 20 MG PO TABS: ORAL_TABLET | ORAL | 0 refills | Status: DC

## 2016-12-30 MED ORDER — ALBUTEROL SULFATE HFA 108 (90 BASE) MCG/ACT IN AERS: 2.0000 | INHALATION_SPRAY | Freq: Four times a day (QID) | RESPIRATORY_TRACT | 0 refills | Status: DC | PRN

## 2016-12-30 MED ORDER — BUDESONIDE-FORMOTEROL FUMARATE 160-4.5 MCG/ACT IN AERO: 2.0000 | INHALATION_SPRAY | Freq: Two times a day (BID) | RESPIRATORY_TRACT | 12 refills | Status: DC

## 2016-12-30 NOTE — Progress Notes (Signed)
Subjective: Chief Complaint  Patient presents with  . allergies    itchy , watery eyes, wheezing some, chest congestion   She notes hx/o allergy problems every spring.   Not currently using allergy medication or other medications for symptoms.   She notes itchy watery eyes, sneezing, some congestion worse in chest.   Having trouble breathing, some wheezing.  Symptoms for past several days.  She notes being sob and wheezy like this in the past with allergy season. Has had to have breathing treatment in the past.   Denies fever, chills, NVD, no productive sputum.  No sick contacts.     No other aggravating or relieving factors. No other complaint.  Past Medical History:  Diagnosis Date  . Allergic rhinitis   . Allergy    pollen   . Cataract   . Diabetes mellitus   . Diverticulosis of colon   . GERD (gastroesophageal reflux disease)   . Helicobacter pylori gastritis   . HLD (hyperlipidemia)   . Hx of adenomatous polyp of colon 08/06/2016  . Hypertension   . Obesity    Current Outpatient Prescriptions on File Prior to Visit  Medication Sig Dispense Refill  . Blood Glucose Monitoring Suppl (CONTOUR BLOOD GLUCOSE SYSTEM) DEVI Test twice daily DX: E11.9 1 Device 0  . glucose blood (BAYER CONTOUR TEST) test strip Use as instructed Patient is to test twice a day DX: E11.9 100 each 3  . Lancets Thin MISC Patient is to test twice a day DX:E11.9 (this is for the contour next) 100 each 3  . lisinopril-hydrochlorothiazide (PRINZIDE,ZESTORETIC) 20-12.5 MG tablet Take 1 tablet by mouth daily. 90 tablet 3  . metFORMIN (GLUCOPHAGE) 1000 MG tablet TAKE ONE TABLET BY MOUTH ONCE DAILY WITH BREAKFAST 90 tablet 1  . Multiple Vitamins-Minerals (MULTIVITAMIN WITH MINERALS) tablet Take 1 tablet by mouth daily.      . simvastatin (ZOCOR) 20 MG tablet Take 1 tablet (20 mg total) by mouth at bedtime. 90 tablet 3  . timolol (BETIMOL) 0.5 % ophthalmic solution 1 drop 2 (two) times daily.     Current  Facility-Administered Medications on File Prior to Visit  Medication Dose Route Frequency Provider Last Rate Last Dose  . 0.9 %  sodium chloride infusion  500 mL Intravenous Continuous Gatha Mayer, MD        Objective: BP 136/88   Pulse 83   Temp 98.1 F (36.7 C)   Wt 186 lb 9.6 oz (84.6 kg)   SpO2 98%   BMI 31.05 kg/m    Wt Readings from Last 3 Encounters:  12/30/16 186 lb 9.6 oz (84.6 kg)  11/30/16 183 lb 12.8 oz (83.4 kg)  08/03/16 186 lb (84.4 kg)    General appearance: Alert, WD/WN, no distress                             Skin: warm, no rash, no diaphoresis                           Head: no sinus tenderness                            Eyes: conjunctiva normal, corneas clear, PERRLA                            Ears: pearly  TMs, external ear canals normal                          Nose: septum midline, turbinates swollen, with erythema and clear discharge             Mouth/throat: MMM, tongue normal, mild pharyngeal erythema                           Neck: supple, no adenopathy, no thyromegaly, non tender                          Heart: RRR, normal S1, S2, no murmurs                         Lungs: scattered wheezes, no rales, no rhonchi                Extremities: no edema, non tender, no calve swelling or tenderness      Assessment: Encounter Diagnoses  Name Primary?  . Wheezing Yes  . Seasonal allergic rhinitis due to pollen   . Cough     Plan Discussed symptoms, exam findings.  After initial triage gave treatment of albuterol nebulized therapy.  This opened up lungs quite a bit and she noted relief.  After repeat exam, no major concern for pneumonia.    Advised rest, hydration, avoid allergens when possible.  Begin OTC ant histamine such as zyrtec QHS.  Begin trial of Symbicort, discussed proper use of medication.  Begin short term round of prednisone.  Begin Albuterol inhaler prn.     Patient Instructions  Your exam and symptoms suggest allergic  asthma  RECOMMENDATION  Begin OTC  Zyrtec daily at bedtime for allergies.    Take the allergy medication daily for the next 1.5 months through spring allergy season  Begin sample of Symbicort, 2 puffs twice daily for the next 2 weeks to help prevent wheezing  You can use Albuterol rescue inhaler as needed, 2 puffs every 4- 6 hours as needed.  I sent this as a prescription  Hydrate well with water  Until you improve avoid strenuous exercise  Use a mask if you dust or mow or do a lot of yard work  Begin short term prednisone steroid oral medication to reduce wheezing and shortness of breath.  This will increase your sugars temporarily       Jennesis was seen today for allergies.  Diagnoses and all orders for this visit:  Wheezing  Seasonal allergic rhinitis due to pollen  Cough  Other orders -     albuterol (PROVENTIL HFA;VENTOLIN HFA) 108 (90 Base) MCG/ACT inhaler; Inhale 2 puffs into the lungs every 6 (six) hours as needed for wheezing or shortness of breath. -     budesonide-formoterol (SYMBICORT) 160-4.5 MCG/ACT inhaler; Inhale 2 puffs into the lungs 2 (two) times daily. -     predniSONE (DELTASONE) 20 MG tablet; 2 tablets daily for 3 days

## 2016-12-30 NOTE — Patient Instructions (Addendum)
Your exam and symptoms suggest allergic asthma  RECOMMENDATION  Begin OTC  Zyrtec daily at bedtime for allergies.    Take the allergy medication daily for the next 1.5 months through spring allergy season  Begin sample of Symbicort, 2 puffs twice daily for the next 2 weeks to help prevent wheezing  You can use Albuterol rescue inhaler as needed, 2 puffs every 4- 6 hours as needed.  I sent this as a prescription  Hydrate well with water  Until you improve avoid strenuous exercise  Use a mask if you dust or mow or do a lot of yard work  Begin short term prednisone steroid oral medication to reduce wheezing and shortness of breath.  This will increase your sugars temporarily

## 2016-12-31 ENCOUNTER — Other Ambulatory Visit: Payer: Self-pay

## 2017-07-04 ENCOUNTER — Other Ambulatory Visit: Payer: Self-pay | Admitting: Family Medicine

## 2017-07-04 DIAGNOSIS — E118 Type 2 diabetes mellitus with unspecified complications: Secondary | ICD-10-CM

## 2017-07-05 NOTE — Telephone Encounter (Signed)
Pt has an appt 11/8. Will refill for 30 days

## 2017-07-05 NOTE — Telephone Encounter (Signed)
Called and l/m for to call us back to make an appt.

## 2017-07-15 ENCOUNTER — Encounter: Payer: Self-pay | Admitting: Family Medicine

## 2017-07-15 ENCOUNTER — Ambulatory Visit (INDEPENDENT_AMBULATORY_CARE_PROVIDER_SITE_OTHER): Payer: Self-pay | Admitting: Family Medicine

## 2017-07-15 VITALS — BP 130/70 | HR 70 | Resp 18 | Wt 186.0 lb

## 2017-07-15 DIAGNOSIS — Z23 Encounter for immunization: Secondary | ICD-10-CM

## 2017-07-15 DIAGNOSIS — E118 Type 2 diabetes mellitus with unspecified complications: Secondary | ICD-10-CM

## 2017-07-15 LAB — POCT GLYCOSYLATED HEMOGLOBIN (HGB A1C): Hemoglobin A1C: 7.5

## 2017-07-15 NOTE — Progress Notes (Signed)
   Subjective:    Patient ID: [name], female    DOB: [birth year], 66 y.o.   MRN: [id number]  HPI she is here for diabetes recheck however she is not covered by insurance and plans to be covered in December. Because of this I decided to wait till him to do any further evaluation and treatment.  Review of Systems     Objective:   Physical Exam Alert and in no distress otherwise not examined   Hemoglobin A1c was 7.5    Assessment & Plan:  Controlled diabetes mellitus type 2 with complications, unspecified whether long term insulin use ([place]) - Plan: HgB A1c  Need for prophylactic vaccination and inoculation against influenza - Plan: Flu Vaccine QUAD 6+ mos PF IM (Fluarix Quad PF) She will be rescheduled for a more complete evaluation when she gets insurance. She was comfortable with that.

## 2017-07-15 NOTE — Progress Notes (Deleted)
  Subjective:    Patient ID: [name], female    DOB: 06/18/1951, 66 y.o.   MRN: [id number]  [name] is a 66 y.o. female who presents for follow-up of Type 2 diabetes mellitus.  Patient is checking home blood sugars.   Home blood sugar records: BGs are high in the morning How often is blood sugars being checked: daily Current symptoms/problems include none and have been stable. Daily foot checks: yes    Any foot concerns: no Last eye exam: August 2018- unable to recall doctor Exercise: 5 x week 30 mins  The following portions of the patient's history were reviewed and updated as appropriate: allergies, current medications, past medical history, past social history and problem list.  ROS as in subjective above.     Objective:    Physical Exam Alert and in no distress otherwise not examined.  Blood pressure 130/70, pulse 70, resp. rate 18, weight 186 lb (84.4 kg), SpO2 98 %.  Lab Review Diabetic Labs Latest Ref Rng & Units 11/30/2016 06/29/2016 02/28/2016 10/16/2015 04/08/2015  HbA1c - - 7.5 7.6 7.3 7.5  Microalbumin mg/L - <5.0 - - 5.0  Micro/Creat Ratio - - <7.6 - - 4.7  Chol 125 - 200 mg/dL - 169 - - 177  HDL >=46 mg/dL - 51 - - 55  Calc LDL <130 mg/dL - 93 - - 102  Triglycerides <150 mg/dL - 127 - - 100  Creatinine 0.50 - 0.99 mg/dL 0.61 0.69 - - 0.54   BP/Weight 07/15/2017 12/30/2016 11/30/2016 08/03/2016 56/81/2751  Systolic BP 700 174 944 967 -  Diastolic BP 70 88 82 77 -  Wt. (Lbs) 186 186.6 183.8 186 186  BMI 30.95 31.05 30.59 30.95 30.95   Foot/eye exam completion dates Latest Ref Rng & Units 10/31/2015 04/08/2015  Eye Exam No Retinopathy No Retinopathy -  Foot Form Completion - - Done    [name]  reports that  has never smoked. she has never used smokeless tobacco. She reports that she does not drink alcohol or use drugs.     Assessment & Plan:    No diagnosis found.  1. Rx changes: {none:33079} 2. Education: Reviewed 'ABCs' of diabetes management  (respective goals in parentheses):  A1C (<7), blood pressure (<130/80), and cholesterol (LDL <100). 3. Compliance at present is estimated to be {good/fair/poor:33178}. Efforts to improve compliance (if necessary) will be directed at {compliance:16716}. 4. Follow up: {NUMBERS; 0-10:33138} {time:11}

## 2017-08-01 ENCOUNTER — Other Ambulatory Visit: Payer: Self-pay | Admitting: Family Medicine

## 2017-08-01 DIAGNOSIS — I10 Essential (primary) hypertension: Principal | ICD-10-CM

## 2017-08-01 DIAGNOSIS — E118 Type 2 diabetes mellitus with unspecified complications: Secondary | ICD-10-CM

## 2017-08-01 DIAGNOSIS — I152 Hypertension secondary to endocrine disorders: Secondary | ICD-10-CM

## 2017-08-01 DIAGNOSIS — E1159 Type 2 diabetes mellitus with other circulatory complications: Secondary | ICD-10-CM

## 2017-09-26 ENCOUNTER — Other Ambulatory Visit: Payer: Self-pay | Admitting: Family Medicine

## 2017-10-04 ENCOUNTER — Ambulatory Visit: Payer: BLUE CROSS/BLUE SHIELD | Admitting: Family Medicine

## 2017-10-04 ENCOUNTER — Encounter: Payer: Self-pay | Admitting: Family Medicine

## 2017-10-04 VITALS — BP 138/84 | HR 88 | Temp 99.9°F | Ht 65.0 in | Wt 183.2 lb

## 2017-10-04 DIAGNOSIS — R509 Fever, unspecified: Secondary | ICD-10-CM

## 2017-10-04 DIAGNOSIS — R059 Cough, unspecified: Secondary | ICD-10-CM

## 2017-10-04 DIAGNOSIS — J069 Acute upper respiratory infection, unspecified: Secondary | ICD-10-CM

## 2017-10-04 DIAGNOSIS — R52 Pain, unspecified: Secondary | ICD-10-CM

## 2017-10-04 DIAGNOSIS — R05 Cough: Secondary | ICD-10-CM

## 2017-10-04 LAB — POC INFLUENZA A&B (BINAX/QUICKVUE)
Influenza A, POC: NEGATIVE
Influenza B, POC: NEGATIVE

## 2017-10-04 MED ORDER — AMOXICILLIN 500 MG PO TABS: 1000.0000 mg | ORAL_TABLET | Freq: Two times a day (BID) | ORAL | 0 refills | Status: DC

## 2017-10-04 NOTE — Progress Notes (Signed)
Chief Complaint  Patient presents with  . Cough    started Thursday night. Had fever, chills and body aches. Lots of mucus and right side is worse than the right.    4 days ago she started with fever, pain in her upper back and cough.  She has gotten a little better--no longer hurting as much. Continues to cough up yellow phlegm.  She continues to have fever (tactile, didn't check with thermometer).   +runny nose, pain in both cheeks, feels like the right one is swollen. +post-nasal drainage, sore throat.  Denies shortness of breath. (has albuterol, hasn't felt like she needed to use it). She has Symbicort, but admits to not using this. Denies asthma.  She took naproxen for the headache.  No other OTC medications tried for this illness.  PMH, PSH, SH reviewed  Outpatient Encounter Medications as of 10/04/2017  Medication Sig  . Blood Glucose Monitoring Suppl (CONTOUR BLOOD GLUCOSE SYSTEM) DEVI Test twice daily DX: E11.9  . CONTOUR NEXT TEST test strip USE AS DIRECTED TWICE DAILY  . Lancets Thin MISC Patient is to test twice a day DX:E11.9 (this is for the contour next)  . lisinopril-hydrochlorothiazide (PRINZIDE,ZESTORETIC) 20-12.5 MG tablet TAKE ONE TABLET BY MOUTH ONCE DAILY  . metFORMIN (GLUCOPHAGE) 1000 MG tablet TAKE ONE TABLET BY MOUTH ONCE DAILY WITH BREAKFAST  . Multiple Vitamins-Minerals (MULTIVITAMIN WITH MINERALS) tablet Take 1 tablet by mouth daily.    . timolol (BETIMOL) 0.5 % ophthalmic solution 1 drop 2 (two) times daily.  Marland Kitchen albuterol (PROVENTIL HFA;VENTOLIN HFA) 108 (90 Base) MCG/ACT inhaler Inhale 2 puffs into the lungs every 6 (six) hours as needed for wheezing or shortness of breath. (Patient not taking: Reported on 10/04/2017)  . budesonide-formoterol (SYMBICORT) 160-4.5 MCG/ACT inhaler Inhale 2 puffs into the lungs 2 (two) times daily. (Patient not taking: Reported on 10/04/2017)  . simvastatin (ZOCOR) 20 MG tablet Take 1 tablet (20 mg total) by mouth at bedtime. (Patient  not taking: Reported on 10/04/2017)  . [DISCONTINUED] metFORMIN (GLUCOPHAGE) 1000 MG tablet TAKE ONE TABLET BY MOUTH ONCE DAILY WITH BREAKFAST  . [DISCONTINUED] predniSONE (DELTASONE) 20 MG tablet 2 tablets daily for 3 days   Facility-Administered Encounter Medications as of 10/04/2017  Medication  . 0.9 %  sodium chloride infusion   She stopped her cholesterol medications, thought it was good. Review of chart shows last check 06/2016, good while on meds. Saw Dr. Redmond School last in November, but didn't have insurance, labs not done.  Allergies  Allergen Reactions  . Brimonidine Other (See Comments)    redness    ROS:  Denies dizziness, chest pain, palpitations, nausea, vomiting, diarrhea, urinary complaints, bleeding, bruising, rash, chest pain, shortness of breath, or other complaints, except as noted in HPI.  PHYSICAL EXAM:  BP 138/84   Pulse 88   Temp 99.9 F (37.7 C) (Tympanic)   Ht 5\' 5"  (1.651 m)   Wt 183 lb 3.2 oz (83.1 kg)   BMI 30.49 kg/m   Pleasant female, appears congested and mildly ill/tired.  Frequent sniffling, no coughing HEENT: PERRL, EOMI, conjunctiva and sclera are clear. Slight effusion on the right, no erythema. L TM and EAC normal. Nasal mucosa is mod edematous, fairly pale, with white mucus.  No erythema. OP is clear. Mildly tender over both maxillary sinuses Neck: no lymphadenopathy or mass Heart: regular rate and rhythm, no murmur Lungs: clear bilaterally Psych normal mood, affect, hygiene and grooming Neuro: alert and oriented, cranial nerves intact, normal gait  Influenza A&B negative  ASSESSMENT/PLAN:   Acute upper respiratory infection, unspecified - supportive measures reviewed. If not improving in 1-3d, to start ABX. - Plan: amoxicillin (AMOXIL) 500 MG tablet  Cough - Plan: POC Influenza A&B (Binax test)  Fever and chills - Plan: POC Influenza A&B (Binax test)  Body aches - Plan: POC Influenza A&B (Binax test)  Schedule physical with  Dr. Redmond School (fasting)   Drink plenty of water. Continue tylenol and/or naproxen as needed for pain or fever. I recommend using guaifenesin (found in Mucinex and robitussin products) to help loosen the mucus (in sinuses, throat and chest) Avoid multi-symptom medications that contain decongestants (ie phenylephrine) as this can raise your blood pressure. I encourage you to try nasal saline spray and sinus rinses or Neti-pot (use distilled or boiled water). This should help relieve your sinus pain/pressure. If you are not improving in the next 1-3 days, with continued yellow mucus and sinus pain and fever, then please start the antibiotic. If you need the antibiotic, it is important that you take the entire course (2 pills twice daily for 10 days). Return if you aren't improving.

## 2017-10-04 NOTE — Patient Instructions (Signed)
  Drink plenty of water. Continue tylenol and/or naproxen as needed for pain or fever. I recommend using guaifenesin (found in Mucinex and robitussin products) to help loosen the mucus (in sinuses, throat and chest) Avoid multi-symptom medications that contain decongestants (ie phenylephrine) as this can raise your blood pressure. I encourage you to try nasal saline spray and sinus rinses or Neti-pot (use distilled or boiled water). This should help relieve your sinus pain/pressure. If you are not improving in the next 1-3 days, with continued yellow mucus and sinus pain and fever, then please start the antibiotic. If you need the antibiotic, it is important that you take the entire course (2 pills twice daily for 10 days). Return if you aren't improving.

## 2017-11-24 ENCOUNTER — Telehealth: Payer: Self-pay | Admitting: Family Medicine

## 2017-11-24 NOTE — Telephone Encounter (Signed)
Pt came in and paid bill. PT states that she is having difficultly getting her mammogram scheduled. She states she can not get anyone to call her back. Please help pt with this. Pt can be reached at [phone].

## 2017-11-25 NOTE — Telephone Encounter (Signed)
Was inform screening mammo could be done at the breast center. Pt was also given address of location. Thanks [employer]

## 2017-11-30 ENCOUNTER — Other Ambulatory Visit: Payer: Self-pay | Admitting: Family Medicine

## 2017-11-30 DIAGNOSIS — Z1231 Encounter for screening mammogram for malignant neoplasm of breast: Secondary | ICD-10-CM

## 2017-12-06 DIAGNOSIS — H401134 Primary open-angle glaucoma, bilateral, indeterminate stage: Secondary | ICD-10-CM | POA: Diagnosis not present

## 2017-12-06 DIAGNOSIS — Z961 Presence of intraocular lens: Secondary | ICD-10-CM | POA: Diagnosis not present

## 2017-12-06 DIAGNOSIS — E119 Type 2 diabetes mellitus without complications: Secondary | ICD-10-CM | POA: Diagnosis not present

## 2017-12-06 DIAGNOSIS — H04123 Dry eye syndrome of bilateral lacrimal glands: Secondary | ICD-10-CM | POA: Diagnosis not present

## 2017-12-09 ENCOUNTER — Ambulatory Visit
Admission: RE | Admit: 2017-12-09 | Discharge: 2017-12-09 | Disposition: A | Discharge status: Home / Self Care | Payer: BLUE CROSS/BLUE SHIELD | Source: Ambulatory Visit | Attending: Family Medicine | Admitting: Family Medicine

## 2017-12-09 DIAGNOSIS — Z1231 Encounter for screening mammogram for malignant neoplasm of breast: Secondary | ICD-10-CM | POA: Diagnosis not present

## 2018-01-03 ENCOUNTER — Ambulatory Visit: Payer: BLUE CROSS/BLUE SHIELD | Admitting: Family Medicine

## 2018-01-03 ENCOUNTER — Encounter: Payer: Self-pay | Admitting: Family Medicine

## 2018-01-03 VITALS — BP 124/82 | HR 89 | Temp 98.1°F | Ht 64.0 in | Wt 184.4 lb

## 2018-01-03 DIAGNOSIS — J301 Allergic rhinitis due to pollen: Secondary | ICD-10-CM | POA: Diagnosis not present

## 2018-01-03 DIAGNOSIS — I10 Essential (primary) hypertension: Secondary | ICD-10-CM

## 2018-01-03 DIAGNOSIS — E1169 Type 2 diabetes mellitus with other specified complication: Secondary | ICD-10-CM | POA: Diagnosis not present

## 2018-01-03 DIAGNOSIS — E1159 Type 2 diabetes mellitus with other circulatory complications: Secondary | ICD-10-CM

## 2018-01-03 DIAGNOSIS — J4520 Mild intermittent asthma, uncomplicated: Secondary | ICD-10-CM | POA: Diagnosis not present

## 2018-01-03 DIAGNOSIS — E785 Hyperlipidemia, unspecified: Secondary | ICD-10-CM

## 2018-01-03 DIAGNOSIS — K219 Gastro-esophageal reflux disease without esophagitis: Secondary | ICD-10-CM

## 2018-01-03 DIAGNOSIS — E118 Type 2 diabetes mellitus with unspecified complications: Secondary | ICD-10-CM | POA: Diagnosis not present

## 2018-01-03 DIAGNOSIS — Z86010 Personal history of colonic polyps: Secondary | ICD-10-CM | POA: Diagnosis not present

## 2018-01-03 DIAGNOSIS — E669 Obesity, unspecified: Secondary | ICD-10-CM

## 2018-01-03 DIAGNOSIS — Z6830 Body mass index (BMI) 30.0-30.9, adult: Secondary | ICD-10-CM

## 2018-01-03 DIAGNOSIS — Z1159 Encounter for screening for other viral diseases: Secondary | ICD-10-CM

## 2018-01-03 DIAGNOSIS — I152 Hypertension secondary to endocrine disorders: Secondary | ICD-10-CM

## 2018-01-03 DIAGNOSIS — H409 Unspecified glaucoma: Secondary | ICD-10-CM | POA: Diagnosis not present

## 2018-01-03 DIAGNOSIS — Z0000 Encounter for general adult medical examination without abnormal findings: Secondary | ICD-10-CM

## 2018-01-03 LAB — POCT URINALYSIS DIP (PROADVANTAGE DEVICE)
BILIRUBIN UA: NEGATIVE
GLUCOSE UA: NEGATIVE mg/dL
Ketones, POC UA: NEGATIVE mg/dL
Leukocytes, UA: NEGATIVE
Nitrite, UA: NEGATIVE
PH UA: 6 (ref 5.0–8.0)
Protein Ur, POC: NEGATIVE mg/dL
RBC UA: NEGATIVE
SPECIFIC GRAVITY, URINE: 1.03
UUROB: 3.5

## 2018-01-03 LAB — POCT GLYCOSYLATED HEMOGLOBIN (HGB A1C): HEMOGLOBIN A1C: 8.3

## 2018-01-03 LAB — POCT UA - MICROALBUMIN
ALBUMIN/CREATININE RATIO, URINE, POC: 6.7
CREATININE, POC: 213.1 mg/dL
Microalbumin Ur, POC: 14.2 mg/L

## 2018-01-03 MED ORDER — SIMVASTATIN 20 MG PO TABS: 20.0000 mg | ORAL_TABLET | Freq: Every day | ORAL | 3 refills | Status: DC

## 2018-01-03 MED ORDER — METFORMIN HCL 1000 MG PO TABS: 1000.0000 mg | ORAL_TABLET | Freq: Two times a day (BID) | ORAL | 1 refills | Status: DC

## 2018-01-03 MED ORDER — ALBUTEROL SULFATE HFA 108 (90 BASE) MCG/ACT IN AERS: 2.0000 | INHALATION_SPRAY | Freq: Four times a day (QID) | RESPIRATORY_TRACT | 0 refills | Status: AC | PRN

## 2018-01-03 MED ORDER — LISINOPRIL-HYDROCHLOROTHIAZIDE 20-12.5 MG PO TABS: 1.0000 | ORAL_TABLET | Freq: Every day | ORAL | 3 refills | Status: DC

## 2018-01-03 NOTE — Patient Instructions (Addendum)
Try Flonase nasal spray and also see if Claritin or Allegra will help Start taking the Metformin twice per day

## 2018-01-03 NOTE — Progress Notes (Signed)
Subjective:    Patient ID: [name], female    DOB: [birth year], 67 y.o.   MRN: [id number]  HPI She is here for a complete examination.  She has had difficulty with allergy symptoms with intermittent headaches, dizziness, sneezing, itchy watery eyes.  She has been on OTC meds for this.  No blurred vision, double vision, weakness.  She does have occasional wheezing and in the past was on a steroid inhaler but presently is not taking it.  She would like a refill on her albuterol inhaler.  She has a history of glaucoma is being followed.  She has had cataract surgery.  Her reflux seems to be under good control.  She continues on lisinopril without trouble.  She does check her blood sugars periodically and did note recently they have been slightly elevated.  Her exercise is quite minimal.  She does intermittently check her feet.  Otherwise her family and social history as well as health maintenance and immunizations was reviewed. She has a previous history of hysterectomy.  Mammography is up-to-date. Review of Systems  All other systems reviewed and are negative.      Objective:   Physical Exam BP 124/82 (BP Location: Left Arm, Patient Position: Sitting)   Pulse 89   Temp 98.1 F (36.7 C)   Ht 5\' 4"  (1.626 m)   Wt 184 lb 6.4 oz (83.6 kg)   SpO2 95%   BMI 31.65 kg/m   General Appearance:    Alert, cooperative, no distress, appears stated age  Head:    Normocephalic, without obvious abnormality, atraumatic  Eyes:    PERRL, conjunctiva/corneas clear, EOM's intact, fundi    benign  Ears:    Normal TM's and external ear canals  Nose:   Nares normal, mucosa normal, no drainage or sinus   tenderness  Throat:   Lips, mucosa, and tongue normal; teeth and gums normal  Neck:   Supple, no lymphadenopathy;  thyroid:  no   enlargement/tenderness/nodules; no carotid   bruit or JVD     Lungs:     Clear to auscultation bilaterally without wheezes, rales or     ronchi; respirations  unlabored      Heart:    Regular rate and rhythm, S1 and S2 normal, no murmur, rub   or gallop  Breast Exam:    Deferred to GYN  Abdomen:     Soft, non-tender, nondistended, normoactive bowel sounds,    no masses, no hepatosplenomegaly  Genitalia:    Deferred to GYN     Extremities:   No clubbing, cyanosis or edema  Pulses:   2+ and symmetric all extremities  Skin:   Skin color, texture, turgor normal, no rashes or lesions  Lymph nodes:   Cervical, supraclavicular, and axillary nodes normal  Neurologic:   CNII-XII intact, normal strength, sensation and gait; reflexes 2+ and symmetric throughout          Psych:   Normal mood, affect, hygiene and grooming.    A1c is 8.3      Assessment & Plan:  Routine general medical examination at a health care facility - Plan: CBC with Differential/Platelet, Comprehensive metabolic panel, Lipid panel, POCT UA - Microalbumin, POCT Urinalysis DIP (Proadvantage Device)  Hyperlipidemia associated with type 2 diabetes mellitus ([facility]) - Plan: simvastatin (ZOCOR) 20 MG tablet, Lipid panel  Gastroesophageal reflux disease without esophagitis  Controlled diabetes mellitus type 2 with complications, unspecified whether long term insulin use ([place]) - Plan: metFORMIN (GLUCOPHAGE) 1000 MG  tablet, CBC with Differential/Platelet, Comprehensive metabolic panel, Lipid panel, POCT UA - Microalbumin, POCT glycosylated hemoglobin (Hb A1C)  Seasonal allergic rhinitis due to pollen  Glaucoma of both eyes, unspecified glaucoma type  Hx of adenomatous polyp of colon  Hypertension associated with diabetes ([place]) - Plan: lisinopril-hydrochlorothiazide (PRINZIDE,ZESTORETIC) 20-12.5 MG tablet, CBC with Differential/Platelet, Comprehensive metabolic panel  Need for hepatitis C screening test - Plan: Hepatitis C antibody  Mild intermittent asthma without complication - Plan: albuterol (PROVENTIL HFA;VENTOLIN HFA) 108 (90 Base) MCG/ACT inhaler  Class 1 obesity with serious  comorbidity and body mass index (BMI) of 30.0 to 30.9 in adult, unspecified obesity type Encouraged her to do a better job of diet and exercise.  I will increase her metformin to to 1000 twice per day.  Start her back on her albuterol to help with her breathing..  She is to call if continued difficulty. simvastatin.   Recheck here in 4 months.

## 2018-01-04 LAB — COMPREHENSIVE METABOLIC PANEL
ALBUMIN: 4.4 g/dL (ref 3.6–4.8)
ALK PHOS: 86 IU/L (ref 39–117)
ALT: 23 IU/L (ref 0–32)
AST: 16 IU/L (ref 0–40)
Albumin/Globulin Ratio: 1.5 (ref 1.2–2.2)
BUN / CREAT RATIO: 17 (ref 12–28)
BUN: 11 mg/dL (ref 8–27)
Bilirubin Total: 0.3 mg/dL (ref 0.0–1.2)
CO2: 26 mmol/L (ref 20–29)
CREATININE: 0.65 mg/dL (ref 0.57–1.00)
Calcium: 10.2 mg/dL (ref 8.7–10.3)
Chloride: 101 mmol/L (ref 96–106)
GFR calc Af Amer: 109 mL/min/{1.73_m2} (ref >59)
GFR calc non Af Amer: 94 mL/min/{1.73_m2} (ref >59)
GLOBULIN, TOTAL: 3 g/dL (ref 1.5–4.5)
GLUCOSE: 107 mg/dL — AB (ref 65–99)
Potassium: 4.5 mmol/L (ref 3.5–5.2)
SODIUM: 141 mmol/L (ref 134–144)
Total Protein: 7.4 g/dL (ref 6.0–8.5)

## 2018-01-04 LAB — CBC WITH DIFFERENTIAL/PLATELET
BASOS ABS: 0 10*3/uL (ref 0.0–0.2)
Basos: 1 %
EOS (ABSOLUTE): 0.5 10*3/uL — ABNORMAL HIGH (ref 0.0–0.4)
EOS: 7 %
HEMATOCRIT: 39.6 % (ref 34.0–46.6)
HEMOGLOBIN: 13 g/dL (ref 11.1–15.9)
IMMATURE GRANULOCYTES: 0 %
Immature Grans (Abs): 0 10*3/uL (ref 0.0–0.1)
LYMPHS: 48 %
Lymphocytes Absolute: 2.9 10*3/uL (ref 0.7–3.1)
MCH: 28.7 pg (ref 26.6–33.0)
MCHC: 32.8 g/dL (ref 31.5–35.7)
MCV: 87 fL (ref 79–97)
MONOCYTES: 7 %
Monocytes Absolute: 0.4 10*3/uL (ref 0.1–0.9)
NEUTROS PCT: 37 %
Neutrophils Absolute: 2.3 10*3/uL (ref 1.4–7.0)
Platelets: 312 10*3/uL (ref 150–379)
RBC: 4.53 x10E6/uL (ref 3.77–5.28)
RDW: 14.7 % (ref 12.3–15.4)
WBC: 6.1 10*3/uL (ref 3.4–10.8)

## 2018-01-04 LAB — LIPID PANEL
CHOLESTEROL TOTAL: 204 mg/dL — AB (ref 100–199)
Chol/HDL Ratio: 3.6 ratio (ref 0.0–4.4)
HDL: 56 mg/dL (ref >39)
LDL CALC: 118 mg/dL — AB (ref 0–99)
TRIGLYCERIDES: 152 mg/dL — AB (ref 0–149)
VLDL CHOLESTEROL CAL: 30 mg/dL (ref 5–40)

## 2018-01-04 LAB — HEPATITIS C ANTIBODY: Hep C Virus Ab: <0.1 s/co ratio (ref 0.0–0.9)

## 2018-02-23 DIAGNOSIS — H401134 Primary open-angle glaucoma, bilateral, indeterminate stage: Secondary | ICD-10-CM | POA: Diagnosis not present

## 2018-03-07 ENCOUNTER — Encounter: Payer: Self-pay | Admitting: Family Medicine

## 2018-03-07 ENCOUNTER — Ambulatory Visit: Payer: BLUE CROSS/BLUE SHIELD | Admitting: Family Medicine

## 2018-03-07 VITALS — BP 140/88 | HR 69 | Temp 98.2°F | Ht 64.5 in | Wt 184.2 lb

## 2018-03-07 DIAGNOSIS — M542 Cervicalgia: Secondary | ICD-10-CM | POA: Diagnosis not present

## 2018-03-07 DIAGNOSIS — M79602 Pain in left arm: Secondary | ICD-10-CM

## 2018-03-07 NOTE — Progress Notes (Signed)
Chief Complaint  Patient presents with  . Shoulder Pain    left shoulder radiating down left arm x1 week    Woke up one day a week ago with left shoulder pain. The pain starts in the left shoulder, and radiates down the left arm to the elbow, never below.  Describes it as a burning pain in the deltoid area.   Massage has helped. She also tried naproxen at bedtime, and that decreases the pain so she can sleep better, only took it twice. She has some tingling in the upper arm, nothing extending below the elbow; denies arm weakness.  She reports having h/o gastritis.  PMH, PSH, SH reviewed  Outpatient Encounter Medications as of 03/07/2018  Medication Sig  . Blood Glucose Monitoring Suppl (CONTOUR BLOOD GLUCOSE SYSTEM) DEVI Test twice daily DX: E11.9  . CONTOUR NEXT TEST test strip USE AS DIRECTED TWICE DAILY  . Lancets Thin MISC Patient is to test twice a day DX:E11.9 (this is for the contour next)  . lisinopril-hydrochlorothiazide (PRINZIDE,ZESTORETIC) 20-12.5 MG tablet Take 1 tablet by mouth daily.  . metFORMIN (GLUCOPHAGE) 1000 MG tablet Take 1 tablet (1,000 mg total) by mouth 2 (two) times daily with a meal.  . Multiple Vitamins-Minerals (MULTIVITAMIN WITH MINERALS) tablet Take 1 tablet by mouth daily.    . simvastatin (ZOCOR) 20 MG tablet Take 1 tablet (20 mg total) by mouth at bedtime.  . timolol (BETIMOL) 0.5 % ophthalmic solution 1 drop 2 (two) times daily.  Marland Kitchen albuterol (PROVENTIL HFA;VENTOLIN HFA) 108 (90 Base) MCG/ACT inhaler Inhale 2 puffs into the lungs every 6 (six) hours as needed for wheezing or shortness of breath. (Patient not taking: Reported on 03/07/2018)  . [DISCONTINUED] amoxicillin (AMOXIL) 500 MG tablet Take 2 tablets (1,000 mg total) by mouth 2 (two) times daily. (Patient not taking: Reported on 01/03/2018)  . [DISCONTINUED] budesonide-formoterol (SYMBICORT) 160-4.5 MCG/ACT inhaler Inhale 2 puffs into the lungs 2 (two) times daily. (Patient not taking: Reported on  10/04/2017)   Facility-Administered Encounter Medications as of 03/07/2018  Medication  . 0.9 %  sodium chloride infusion   Allergies  Allergen Reactions  . Brimonidine Other (See Comments)    redness   ROS: Denies neck pain, fever, chills, URI symptoms, chest pain, shortness of breath. No bleeding, bruising, rash. Burning/tingling in left upper arm per HPI, no weakness.  No chest pain, shortness of breath, URI symptoms or other concerns.   PHYSICAL EXAM:  BP 140/88   Pulse 69   Temp 98.2 F (36.8 C) (Oral)   Ht 5' 4.5" (1.638 m)   Wt 184 lb 3.2 oz (83.6 kg)   SpO2 96%   BMI 31.13 kg/m   Well appearing, pleasant female, in good spirits, in no distress HEENT: conjunctiva and sclera are clear, EOMI Neck: no lymphadenopathy or mass No c-spine tenderness Tender at left trapezius and left deltoid muscles FROM of neck and left shoulder--only slight discomfort with abduction, rest is not painful Slight discomfort with inter/ext rotation against resistance And with deltoid testing against resistance. Normal strength, sensation, DTR's.  ASSESSMENT/PLAN:  Neck pain on left side - related to trapezius muscle. Shown stretches. heat, massage, Aleve 2BID for up to 2 weeks, along with PPI. Risks reviewed, Decrease dose if SE  Left arm pain - muscular, related to deltoid. Shown stretches. Heat and NSAIDs rec. Risks/SE reviewed.  f/u in 2 weeks if not improving, sooner if weakness or other sx develop  Shown stretches for neck, shoulder. Risks/SE and NSAID precautions  reviewed in detail Has plenty of OTC naproxen, so will use that at 2BID rather than rx.   Take the naproxen 220mg  that you have at home already 2 pills together, twice a day with food (breakfast and dinner).  Take it this way until your pain is completely better--for at least a week, but up to 2 weeks, if it takes that long.  If you aren't better after 2 weeks, please return for recheck. I also want you doing the neck  stretches and arm stretch as shown, twice daily. Heat and massage will also help.  Use the heat prior to doing the stretches (you'll get a better stretch).  If you develop any stomach pain, please cut back on the naproxen to just 1 pill at a time. Since you have a history of gastritis, it would be safest if you took prilosec OTC or zantac daily while you are taking the naproxen (to prevent ulcer or gastritis). Be sure to avoid all other anti-inflammatories.  The only other medication you can take for pain, along with the naproxen, is tylenol (acetaminophen).

## 2018-03-07 NOTE — Patient Instructions (Addendum)
Take the naproxen 220mg  that you have at home already 2 pills together, twice a day with food (breakfast and dinner).  Take it this way until your pain is completely better--for at least a week, but up to 2 weeks, if it takes that long.  If you aren't better after 2 weeks, please return for recheck. I also want you doing the neck stretches and arm stretch as shown, twice daily. Heat and massage will also help.  Use the heat prior to doing the stretches (you'll get a better stretch).  Do the stretches holding for 10 seconds, 10 repetitions, twice daily, in each direction.   If you develop any stomach pain, please cut back on the naproxen to just 1 pill at a time.  If you develop any stomach pain, please cut back on the naproxen to just 1 pill at a time. Since you have a history of gastritis, it would be safest if you took prilosec OTC (or Nexium) or zantac daily while you are taking the naproxen (to prevent ulcer or gastritis). Be sure to avoid all other anti-inflammatories.  The only other medication you can take for pain, along with the naproxen, is tylenol (acetaminophen).    Ejercicios para el cuello Neck Exercises Los ejercicios para el cuello pueden ser importantes por muchos motivos:  Pueden ayudarlo a mejorar y Theatre manager la flexibilidad del cuello. Esto puede ser sumamente importante a medida que envejece.  Pueden ayudarlo a fortalecer el cuello. Esto facilitar los movimientos.  Pueden reducir o Information systems manager de cuello.  Pueden ayudar a [employer] parte superior de la espalda.  Hable con su mdico acerca de los ejercicios para el cuello que son recomendables para usted. Ejercicios Presin con el cuello Repita este ejercicio 10veces. Hgalo apenas se despierte por la maana y justo antes de [place], o como se lo haya indicado el mdico. 1. Recustese sobre su espalda en una cama firme o en el suelo, y coloque una almohada debajo de la cabeza. 2. Use los  msculos del cuello para presionar la cabeza contra la almohada y enderezar la columna vertebral. 3. Mantenga la posicin lo mejor que pueda. Mantenga la cabeza hacia arriba y el mentn [place]. 4. Cuente lentamente hasta 50mientras mantiene esta posicin. 5. Relaje algunos segundos. Repita.  Fortalecimiento isomtrico Engineer, production serie completa de ejercicios 2veces al da o como se lo haya indicado el mdico. 1. Sintese en una silla con buen 59 y coloque una mano sobre la frente. 2. Empuje hacia adelante con la cabeza y el cuello mientras que con la mano ejerce cierta presin hacia el lado contrario. Mantenga esta posicin durante 10segundos. 3. Reljese. Repita el ejercicio 3veces. 4. A continuacin, vuelva a realizar la secuencia, esta vez poniendo la mano contra la nuca. Use la cabeza y el cuello para empujar en la direccin contraria a la presin que ejerce con la mano. 5. Para terminar, haga el mismo ejercicio en cada lado de la cabeza, empujando hacia los lados en la direccin contraria a la presin de Insurance risk surveyor.  Levantamiento de la cabeza desde la posicin decbito prono Repita este ejercicio 5veces. Hgalo 2veces al da o como se lo haya indicado el mdico. 1. Acustese boca abajo y apoye los codos de modo que el pecho y la parte superior de la espalda se eleven. 2. Comience con la cabeza mirando hacia abajo, cerca del pecho. Coloque el mentn cerca del pecho o [place]. 3. Eleve lentamente la cabeza.  Hgalo hasta quedar mirando hacia adelante. Contine elevando y estirando la cabeza hacia atrs lo ms que pueda. 4. Mantenga la cabeza elevada durante 5segundos. A continuacin, bjela lentamente hasta la posicin inicial.  Levantamiento de la cabeza desde la posicin decbito supino Repita este ejercicio de 8 a 10veces. Hgalo 2veces al da o como se lo haya indicado el mdico. 1. Acustese sobre la espalda, doble las rodillas apuntando hacia el techo y [employer] pies  apoyados en el piso. 2. Levante lentamente la cabeza del suelo, y eleve el mentn hacia el [place]. 3. Mantenga esta posicin durante 5segundos. 4. Reljese y luego repita el ejercicio.  Retraccin escapular Repita este ejercicio 5veces. Hgalo 2veces al da o como se lo haya indicado el mdico. 1. Prese con los brazos a los costados. Mire hacia adelante. 2. Lentamente, lleve los hombros hacia atrs y hacia abajo hasta sentir que la zona de los omplatos, en la parte alta de la espalda, se estira. 3. Mantenga esta posicin durante 10 a 30segundos. 4. Reljese y luego repita el ejercicio.  Comunquese con un mdico si:  El dolor o las molestias en el cuello se vuelven mucho ms intensos cuando hace un ejercicio.  El dolor o las molestias en el cuello no mejoran en el trmino de las 2horas posteriores a Therapist, art. Si tiene alguno de [employer], deje de [employer] ejercicios de inmediato. No vuelva a hacer los ejercicios a menos que el mdico lo autorice. Solicite ayuda de inmediato si:  Siente un dolor sbito e intenso en el cuello. Si esto ocurre, deje de [employer] ejercicios de inmediato. No vuelva a hacer los ejercicios a menos que el mdico lo autorice. Ejercicios Estiramiento de cuello  Repita este ejercicio de 3 a 5veces. 1. Haga este ejercicio de pie o sentado en una silla. 2. Apoye los pies en el piso, con una separacin del ancho de los hombros. 3. Gire lentamente la cabeza hacia la derecha. Grela completamente hacia la derecha de modo que pueda ver por encima de su hombro derecho. No incline ni ladee la cabeza. 4. Mantenga esta posicin durante 10 a 30segundos. 5. Gire lentamente la cabeza hacia la izquierda, hasta poder ver por encima de su hombro izquierdo. 6. Mantenga esta posicin durante 10 a 30segundos.  Retraccin del cuello Repita este ejercicio de 8 a 10veces. Hgalo 3 o 4veces al da, o como se lo haya indicado el mdico. 1. Haga este  ejercicio de pie o sentado en una silla firme. 2. Mire hacia adelante. No doble el cuello. 3. Use los dedos para empujar la barbilla hacia atrs. No doble el cuello al [employer]. Siga mirando hacia adelante. Si hace el ejercicio correctamente, tendr Ardelia Mems leve sensacin en la garganta y sentir que la nuca se estira. 4. Mantenga la elongacin durante 1o 2segundos. Reljese y luego repita el ejercicio.  Esta informacin no tiene Marine scientist el consejo del mdico. Asegrese de hacerle al mdico cualquier pregunta que tenga. Document Released: 09/20/2015 Document Revised: 11/27/2016 Document Reviewed: 03/04/2015 Elsevier Interactive Patient Education  [employer].

## 2018-03-09 ENCOUNTER — Ambulatory Visit: Payer: BLUE CROSS/BLUE SHIELD | Admitting: Family Medicine

## 2018-03-21 ENCOUNTER — Emergency Department (HOSPITAL_COMMUNITY)
Admission: EM | Admit: 2018-03-21 | Discharge: 2018-03-21 | Disposition: A | Discharge status: Home / Self Care | Payer: BLUE CROSS/BLUE SHIELD | Attending: Emergency Medicine | Admitting: Emergency Medicine

## 2018-03-21 ENCOUNTER — Encounter (HOSPITAL_COMMUNITY): Payer: Self-pay | Admitting: Emergency Medicine

## 2018-03-21 ENCOUNTER — Emergency Department (HOSPITAL_COMMUNITY): Payer: BLUE CROSS/BLUE SHIELD

## 2018-03-21 DIAGNOSIS — R202 Paresthesia of skin: Secondary | ICD-10-CM | POA: Diagnosis not present

## 2018-03-21 DIAGNOSIS — E119 Type 2 diabetes mellitus without complications: Secondary | ICD-10-CM | POA: Insufficient documentation

## 2018-03-21 DIAGNOSIS — I10 Essential (primary) hypertension: Secondary | ICD-10-CM | POA: Insufficient documentation

## 2018-03-21 DIAGNOSIS — M62838 Other muscle spasm: Secondary | ICD-10-CM | POA: Insufficient documentation

## 2018-03-21 DIAGNOSIS — Z79899 Other long term (current) drug therapy: Secondary | ICD-10-CM | POA: Diagnosis not present

## 2018-03-21 DIAGNOSIS — M25512 Pain in left shoulder: Secondary | ICD-10-CM | POA: Diagnosis not present

## 2018-03-21 DIAGNOSIS — R079 Chest pain, unspecified: Secondary | ICD-10-CM | POA: Diagnosis not present

## 2018-03-21 DIAGNOSIS — Z7984 Long term (current) use of oral hypoglycemic drugs: Secondary | ICD-10-CM | POA: Insufficient documentation

## 2018-03-21 LAB — BASIC METABOLIC PANEL
Anion gap: 11 (ref 5–15)
BUN: 15 mg/dL (ref 8–23)
CHLORIDE: 104 mmol/L (ref 98–111)
CO2: 25 mmol/L (ref 22–32)
CREATININE: 0.58 mg/dL (ref 0.44–1.00)
Calcium: 9.8 mg/dL (ref 8.9–10.3)
GFR calc Af Amer: >60 mL/min (ref >60)
GLUCOSE: 93 mg/dL (ref 70–99)
POTASSIUM: 4.2 mmol/L (ref 3.5–5.1)
SODIUM: 140 mmol/L (ref 135–145)

## 2018-03-21 LAB — CBC
HEMATOCRIT: 41.5 % (ref 36.0–46.0)
Hemoglobin: 12.9 g/dL (ref 12.0–15.0)
MCH: 28.5 pg (ref 26.0–34.0)
MCHC: 31.1 g/dL (ref 30.0–36.0)
MCV: 91.6 fL (ref 78.0–100.0)
Platelets: 336 10*3/uL (ref 150–400)
RBC: 4.53 MIL/uL (ref 3.87–5.11)
RDW: 14.6 % (ref 11.5–15.5)
WBC: 6.7 10*3/uL (ref 4.0–10.5)

## 2018-03-21 LAB — I-STAT TROPONIN, ED: Troponin i, poc: 0 ng/mL (ref 0.00–0.08)

## 2018-03-21 MED ORDER — CYCLOBENZAPRINE HCL 10 MG PO TABS: 10.0000 mg | ORAL_TABLET | Freq: Every day | ORAL | 0 refills | Status: DC

## 2018-03-21 NOTE — Discharge Instructions (Signed)
Try seeing a therapeutic massage to help with the left shoulder/back pain.  Try taking muscle relaxors at night for spasm and you can take the naproxen as needed that your doctor gave you.  You can try heat therapy as well.

## 2018-03-21 NOTE — ED Provider Notes (Signed)
[facility]PART[facility] Provider Note   CSN: [id number] Arrival date & time: 03/21/18  1130     History   Chief Complaint Chief Complaint  Patient presents with  . Shoulder Pain  . arm pain    HPI [name] is a 67 y.o. female.  The history is provided by the patient.  Shoulder Pain   This is a new problem. Episode onset: 1 month. The problem occurs daily. The problem has been gradually worsening. The pain is present in the left shoulder. The quality of the pain is described as aching and constant. The pain is at a severity of 6/10. The pain is moderate. Associated symptoms include numbness. Associated symptoms comments: Numbness in the left thumb which has been more constant in the last week.  No chest pain, SOB, cough, fever.  No leg swelling or recent med changes.  Some improvement with NSAIds morning and night over the last few weeks.  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  She is right handed.  Pain started one morning after waking up.. She has tried arthritis medications for the symptoms. The treatment provided no relief. There has been no history of extremity trauma. Family history is significant for no rheumatoid arthritis.    Past Medical History:  Diagnosis Date  . Allergic rhinitis   . Allergy    pollen   . Cataract   . Diabetes mellitus   . Diverticulosis of colon   . GERD (gastroesophageal reflux disease)   . Helicobacter pylori gastritis   . HLD (hyperlipidemia)   . Hx of adenomatous polyp of colon 08/06/2016  . Hypertension   . Obesity     Patient Active Problem List   Diagnosis Date Noted  . Eosinophils increased 12/01/2016  . Hx of adenomatous polyp of colon 08/06/2016  . Hyperlipidemia associated with type 2 diabetes mellitus ([place]) 08/01/2014  . Glaucoma (increased eye pressure) 08/01/2014  . OAB (overactive bladder) 01/01/2014  . History of positive PPD, treatment status unknown 01/01/2014  . Tension headache  07/17/2011  . Hypertension associated with diabetes ([place]) 02/25/2011  . Obesity 02/25/2011  . Diabetes mellitus type 2, controlled, with complications ([place]) 99/24/2683  . GERD (gastroesophageal reflux disease) 02/25/2011  . Cataract associated with type 2 diabetes mellitus ([place]) 02/25/2011  . Diverticulosis 02/25/2011  . Allergic rhinitis 02/25/2011    Past Surgical History:  Procedure Laterality Date  . CATARACT EXTRACTION Bilateral   . COLONOSCOPY  04/08/2006; 07/2016  . UPPER GASTROINTESTINAL ENDOSCOPY  06/16/2011   H. pylori gastritis with polyp and hyperakeratosis of esophagus  . VAGINAL HYSTERECTOMY  09/27/2006   and left paraovarian cystectomy     OB History   None      Home Medications    Prior to Admission medications   Medication Sig Start Date End Date Taking? Authorizing Provider  albuterol (PROVENTIL HFA;VENTOLIN HFA) 108 (90 Base) MCG/ACT inhaler Inhale 2 puffs into the lungs every 6 (six) hours as needed for wheezing or shortness of breath. Patient not taking: Reported on 03/07/2018 01/03/18   Denita Lung, MD  Blood Glucose Monitoring Suppl [facility] BLOOD GLUCOSE SYSTEM) DEVI Test twice daily DX: E11.9 07/14/16   Denita Lung, MD  CONTOUR NEXT TEST test strip USE AS DIRECTED TWICE DAILY 09/27/17   Denita Lung, MD  Lancets Thin MISC Patient is to test twice a day DX:E11.9 (this is for the contour next) 07/14/16   Denita Lung, MD  lisinopril-hydrochlorothiazide ([place]) 20-12.5  MG tablet Take 1 tablet by mouth daily. 01/03/18   Denita Lung, MD  metFORMIN (GLUCOPHAGE) 1000 MG tablet Take 1 tablet (1,000 mg total) by mouth 2 (two) times daily with a meal. 01/03/18   Denita Lung, MD  Multiple Vitamins-Minerals (MULTIVITAMIN WITH MINERALS) tablet Take 1 tablet by mouth daily.      [provider]  simvastatin (ZOCOR) 20 MG tablet Take 1 tablet (20 mg total) by mouth at bedtime. 01/03/18   Denita Lung, MD  timolol (BETIMOL) 0.5 %  ophthalmic solution 1 drop 2 (two) times daily.    [provider]    Family History Family History  Problem Relation Age of Onset  . Colon cancer Paternal Uncle   . Diabetes Mother   . Heart disease Mother   . Colon polyps Cousin   . Rectal cancer Neg Hx   . Stomach cancer Neg Hx   . Esophageal cancer Neg Hx     Social History Social History   Tobacco Use  . Smoking status: Never Smoker  . Smokeless tobacco: Never Used  Substance Use Topics  . Alcohol use: No  . Drug use: No     Allergies   Brimonidine   Review of Systems Review of Systems  Neurological: Positive for numbness.  All other systems reviewed and are negative.    Physical Exam Updated Vital Signs BP 112/83 (BP Location: Right Arm)   Pulse 75   Temp 98 F (36.7 C) (Oral)   Resp 16   SpO2 98%   Physical Exam  Constitutional: She is oriented to person, place, and time. She appears well-developed and well-nourished. No distress.  HENT:  Head: Normocephalic and atraumatic.  Eyes: Pupils are equal, round, and reactive to light. EOM are normal.  Cardiovascular: Normal rate, regular rhythm, normal heart sounds and intact distal pulses. Exam reveals no friction rub.  No murmur heard. 2+ left radial pulse  Pulmonary/Chest: Effort normal and breath sounds normal. She has no wheezes. She has no rales.  Abdominal: Soft. Bowel sounds are normal. She exhibits no distension. There is no tenderness. There is no rebound and no guarding.  Musculoskeletal: Normal range of motion. She exhibits tenderness.       Right shoulder: She exhibits pain and spasm. She exhibits normal pulse and normal strength.       Arms: No edema  Neurological: She is alert and oriented to person, place, and time. No cranial nerve deficit.  Skin: Skin is warm and dry. Capillary refill takes less than 2 seconds. No rash noted.  Psychiatric: She has a normal mood and affect. Her behavior is normal.  Nursing note and vitals  reviewed.    ED Treatments / Results  Labs (all labs ordered are listed, but only abnormal results are displayed) Labs Reviewed  BASIC METABOLIC PANEL  CBC  I-STAT TROPONIN, ED    EKG EKG Interpretation  Date/Time:  Monday March 21 2018 12:01:50 EDT Ventricular Rate:  73 PR Interval:  160 QRS Duration: 88 QT Interval:  398 QTC Calculation: 438 R Axis:   -25 Text Interpretation:  Normal sinus rhythm Normal ECG No significant change since last tracing Confirmed by Blanchie Dessert [id number]) on 03/21/2018 4:53:44 PM   Radiology Dg Chest 2 View  Result Date: 03/21/2018 CLINICAL DATA:  Chest pain EXAM: CHEST - 2 VIEW COMPARISON:  September 21, 2006 FINDINGS: There is no edema or consolidation. Heart size and pulmonary vascularity are normal. No adenopathy. There is degenerative change  in the lower thoracic region. IMPRESSION: No edema or consolidation. Electronically Signed   By: Lowella Grip III M.D.   On: 03/21/2018 12:54    Procedures Procedures (including critical care time)  Medications Ordered in ED Medications - No data to display   Initial Impression / Assessment and Plan / ED Course  I have reviewed the triage vital signs and the nursing notes.  Pertinent labs & imaging results that were available during my care of the patient were reviewed by me and considered in my medical decision making (see chart for details).     67 year old female with a history of diabetes, hypertension presenting today with left shoulder pain that seems to be most likely musculoskeletal in nature.  It is been ongoing over the last month and seems to be worsening.  It started one morning when she woke up and thought she may have slept wrong.  Is also causing paresthesias in her left thumb that are intermittent.  Pain is reproducible by palpation over the muscles in the posterior area of the shoulder including the teres muscles, latissimus and spinatus muscles.  She has normal radial pulse and  low suspicion that this is vascular in nature.  She has no midline neck pain.  Her pain is improved with her shoulder completely abducted and she moves it without difficulty with low suspicion for shoulder joint problem.  No sign of a septic joint.  Low suspicion that this is cardiac in nature as she has normal EKG and lab work which was done prior to being seen.  She also has a normal chest x-ray today.  She has been taking NSAIDs intermittently which could help some but the pain is not gone away.  We will do a muscle relaxer that she can try at night and also recommended heat and therapeutic massage.  Final Clinical Impressions(s) / ED Diagnoses   Final diagnoses:  Muscle spasm of left shoulder area    ED Discharge Orders        Ordered    cyclobenzaprine (FLEXERIL) 10 MG tablet  Daily at bedtime     03/21/18 1731       Blanchie Dessert, MD 03/21/18 1731

## 2018-03-21 NOTE — ED Triage Notes (Signed)
Pt states left shoulder pain radiating into the left arm for weeks, that comes and goes. numbness top the left thumb. Pain is not worse with movement.

## 2018-08-08 ENCOUNTER — Other Ambulatory Visit: Payer: Self-pay | Admitting: Family Medicine

## 2018-08-08 DIAGNOSIS — E118 Type 2 diabetes mellitus with unspecified complications: Secondary | ICD-10-CM

## 2018-09-16 ENCOUNTER — Ambulatory Visit: Payer: Self-pay | Admitting: Family Medicine

## 2018-09-21 DIAGNOSIS — H401134 Primary open-angle glaucoma, bilateral, indeterminate stage: Secondary | ICD-10-CM | POA: Diagnosis not present

## 2018-09-21 DIAGNOSIS — Z961 Presence of intraocular lens: Secondary | ICD-10-CM | POA: Diagnosis not present

## 2018-10-04 ENCOUNTER — Encounter: Payer: Self-pay | Admitting: Family Medicine

## 2018-10-11 ENCOUNTER — Encounter: Payer: Self-pay | Admitting: Family Medicine

## 2018-10-11 ENCOUNTER — Ambulatory Visit (INDEPENDENT_AMBULATORY_CARE_PROVIDER_SITE_OTHER): Payer: PPO | Admitting: Family Medicine

## 2018-10-11 VITALS — BP 142/92 | HR 51 | Temp 97.9°F | Wt 188.6 lb

## 2018-10-11 DIAGNOSIS — E118 Type 2 diabetes mellitus with unspecified complications: Secondary | ICD-10-CM | POA: Diagnosis not present

## 2018-10-11 DIAGNOSIS — H409 Unspecified glaucoma: Secondary | ICD-10-CM | POA: Diagnosis not present

## 2018-10-11 DIAGNOSIS — I10 Essential (primary) hypertension: Secondary | ICD-10-CM | POA: Diagnosis not present

## 2018-10-11 DIAGNOSIS — E785 Hyperlipidemia, unspecified: Secondary | ICD-10-CM | POA: Diagnosis not present

## 2018-10-11 DIAGNOSIS — E1169 Type 2 diabetes mellitus with other specified complication: Secondary | ICD-10-CM | POA: Diagnosis not present

## 2018-10-11 DIAGNOSIS — E1159 Type 2 diabetes mellitus with other circulatory complications: Secondary | ICD-10-CM | POA: Diagnosis not present

## 2018-10-11 DIAGNOSIS — Z23 Encounter for immunization: Secondary | ICD-10-CM | POA: Diagnosis not present

## 2018-10-11 DIAGNOSIS — J0100 Acute maxillary sinusitis, unspecified: Secondary | ICD-10-CM

## 2018-10-11 DIAGNOSIS — I152 Hypertension secondary to endocrine disorders: Secondary | ICD-10-CM

## 2018-10-11 DIAGNOSIS — J301 Allergic rhinitis due to pollen: Secondary | ICD-10-CM

## 2018-10-11 LAB — POCT GLYCOSYLATED HEMOGLOBIN (HGB A1C): HEMOGLOBIN A1C: 7.2 % — AB (ref 4.0–5.6)

## 2018-10-11 MED ORDER — AMOXICILLIN 875 MG PO TABS
875.0000 mg | ORAL_TABLET | Freq: Two times a day (BID) | ORAL | 0 refills | Status: DC
Start: 2018-10-11 — End: 2019-07-28

## 2018-10-11 NOTE — Patient Instructions (Signed)
Take all the antibiotic and if not totally back to normal when you finish call me and I will give you more can

## 2018-10-11 NOTE — Progress Notes (Signed)
  Subjective:    Patient ID: [name], female    DOB: 01-06-1951, 68 y.o.   MRN: [id number]  [name] is a 68 y.o. female who presents for follow-up of Type 2 diabetes mellitus.  Patient is checking home blood sugars.   Home blood sugar records: meter record How often is blood sugars being checked: 112-156 QD Current symptoms/problems include none at this time. Daily foot checks: yes   Any foot concerns: none Last eye exam: 09-21-2018 Exercise: walking 30 min daily She continues on lisinopril as well as metformin and simvastatin.  She is having no difficulty with any of these medications.  She has had a 10-day history of sinus congestion, nasal congestion with rhinorrhea and purulent discharge.  PND as well but no fever chills or earache.  She is now retired and enjoying her retirement. The following portions of the patient's history were reviewed and updated as appropriate: allergies, current medications, past medical history, past social history and problem list.  ROS as in subjective above.     Objective:    Physical Exam  Alert and in no distress.  Nasal mucosa is normal but tender over maxillary sinuses tympanic membranes and canals are normal. Pharyngeal area is normal. Neck is supple without adenopathy or thyromegaly. Cardiac exam shows a regular sinus rhythm without murmurs or gallops. Lungs are clear to auscultation.   A1c is 7.2 Lab Review Diabetic Labs Latest Ref Rng & Units 03/21/2018 01/03/2018 07/15/2017 11/30/2016 06/29/2016  HbA1c - - 8.3 7.5 - 7.5  Microalbumin mg/L - 14.2 - - <5.0  Micro/Creat Ratio - - 6.7 - - <7.6  Chol 100 - 199 mg/dL - 204(H) - - 169  HDL >39 mg/dL - 56 - - 51  Calc LDL 0 - 99 mg/dL - 118(H) - - 93  Triglycerides 0 - 149 mg/dL - 152(H) - - 127  Creatinine 0.44 - 1.00 mg/dL 0.58 0.65 - 0.61 0.69   BP/Weight 03/21/2018 03/07/2018 01/03/2018 10/04/2017 92/09/1939  Systolic BP 740 814 481 856 314  Diastolic BP 78 88 82 84 70    Wt. (Lbs) - 184.2 184.4 183.2 186  BMI - 31.13 31.65 30.49 30.95   Foot/eye exam completion dates Latest Ref Rng & Units 10/31/2015 04/08/2015  Eye Exam No Retinopathy No Retinopathy -  Foot Form Completion - - Done    Chayanne  reports that she has never smoked. She has never used smokeless tobacco. She reports that she does not drink alcohol or use drugs.     Assessment & Plan:    Controlled diabetes mellitus type 2 with complications, unspecified whether long term insulin use (HCC)  Need for influenza vaccination - Plan: Flu vaccine HIGH DOSE PF (Fluzone High dose)  Acute non-recurrent maxillary sinusitis - Plan: amoxicillin (AMOXIL) 875 MG tablet  Glaucoma of both eyes, unspecified glaucoma type  Hyperlipidemia associated with type 2 diabetes mellitus (HCC)  Seasonal allergic rhinitis due to pollen  Hypertension associated with diabetes ([place])   1. Rx changes: Amoxil 2. Education: Reviewed 'ABCs' of diabetes management (respective goals in parentheses):  A1C (<7), blood pressure (<130/80), and cholesterol (LDL <100). 3. Compliance at present is estimated to be good. Efforts to improve compliance (if necessary) will be directed at increased exercise. 4. Follow up: 4 months She will call me if not entirely better when she finishes the antibiotic.

## 2018-11-01 ENCOUNTER — Other Ambulatory Visit: Payer: Self-pay | Admitting: Family Medicine

## 2018-11-01 DIAGNOSIS — Z1231 Encounter for screening mammogram for malignant neoplasm of breast: Secondary | ICD-10-CM

## 2018-11-25 ENCOUNTER — Other Ambulatory Visit: Payer: Self-pay | Admitting: Family Medicine

## 2018-12-01 ENCOUNTER — Telehealth: Payer: Self-pay

## 2018-12-01 MED ORDER — ONETOUCH DELICA LANCETS 33G MISC: 1 refills | Status: AC

## 2018-12-01 MED ORDER — GLUCOSE BLOOD VI STRP: ORAL_STRIP | 12 refills | Status: DC

## 2018-12-01 NOTE — Telephone Encounter (Signed)
[facility] sent a fax stating that patient insurance will cover [employer] products. They have requested that we sent script for [employer] Verio meter,test strips, and lancets.

## 2018-12-12 ENCOUNTER — Ambulatory Visit: Payer: BLUE CROSS/BLUE SHIELD

## 2019-01-29 ENCOUNTER — Other Ambulatory Visit: Payer: Self-pay | Admitting: Family Medicine

## 2019-01-29 DIAGNOSIS — I152 Hypertension secondary to endocrine disorders: Secondary | ICD-10-CM

## 2019-01-29 DIAGNOSIS — E1159 Type 2 diabetes mellitus with other circulatory complications: Secondary | ICD-10-CM

## 2019-02-09 ENCOUNTER — Ambulatory Visit: Payer: PPO | Admitting: Family Medicine

## 2019-02-13 ENCOUNTER — Other Ambulatory Visit: Payer: Self-pay

## 2019-02-13 ENCOUNTER — Ambulatory Visit
Admission: RE | Admit: 2019-02-13 | Discharge: 2019-02-13 | Disposition: A | Discharge status: Home / Self Care | Payer: BLUE CROSS/BLUE SHIELD | Source: Ambulatory Visit | Attending: Family Medicine | Admitting: Family Medicine

## 2019-02-13 DIAGNOSIS — Z1231 Encounter for screening mammogram for malignant neoplasm of breast: Secondary | ICD-10-CM | POA: Diagnosis not present

## 2019-02-14 ENCOUNTER — Ambulatory Visit (INDEPENDENT_AMBULATORY_CARE_PROVIDER_SITE_OTHER): Payer: PPO | Admitting: Family Medicine

## 2019-02-14 ENCOUNTER — Encounter: Payer: Self-pay | Admitting: Family Medicine

## 2019-02-14 VITALS — BP 140/92 | HR 76 | Temp 98.2°F | Wt 188.6 lb

## 2019-02-14 DIAGNOSIS — I10 Essential (primary) hypertension: Secondary | ICD-10-CM

## 2019-02-14 DIAGNOSIS — E1136 Type 2 diabetes mellitus with diabetic cataract: Secondary | ICD-10-CM

## 2019-02-14 DIAGNOSIS — I152 Hypertension secondary to endocrine disorders: Secondary | ICD-10-CM

## 2019-02-14 DIAGNOSIS — E785 Hyperlipidemia, unspecified: Secondary | ICD-10-CM

## 2019-02-14 DIAGNOSIS — E118 Type 2 diabetes mellitus with unspecified complications: Secondary | ICD-10-CM

## 2019-02-14 DIAGNOSIS — E1159 Type 2 diabetes mellitus with other circulatory complications: Secondary | ICD-10-CM | POA: Diagnosis not present

## 2019-02-14 DIAGNOSIS — H409 Unspecified glaucoma: Secondary | ICD-10-CM | POA: Diagnosis not present

## 2019-02-14 DIAGNOSIS — E2839 Other primary ovarian failure: Secondary | ICD-10-CM

## 2019-02-14 DIAGNOSIS — E1169 Type 2 diabetes mellitus with other specified complication: Secondary | ICD-10-CM | POA: Diagnosis not present

## 2019-02-14 LAB — POCT UA - MICROALBUMIN
Albumin/Creatinine Ratio, Urine, POC: <12.4
Creatinine, POC: 40.4 mg/dL
Microalbumin Ur, POC: <5 mg/L

## 2019-02-14 LAB — POCT GLYCOSYLATED HEMOGLOBIN (HGB A1C): Hemoglobin A1C: 8.1 % — AB (ref 4.0–5.6)

## 2019-02-14 MED ORDER — AMLODIPINE BESYLATE 5 MG PO TABS: 5.0000 mg | ORAL_TABLET | Freq: Every day | ORAL | 3 refills | Status: DC

## 2019-02-14 MED ORDER — CANAGLIFLOZIN 300 MG PO TABS: 300.0000 mg | ORAL_TABLET | Freq: Every day | ORAL | 5 refills | Status: DC

## 2019-02-14 NOTE — Progress Notes (Signed)
  Subjective:    Patient ID: [name], female    DOB: 1950/11/25, 68 y.o.   MRN: [id number]  [name] is a 68 y.o. female who presents for follow-up of Type 2 diabetes mellitus.  Patient is checking home blood sugars.   Home blood sugar records: meter records How often is blood sugars being checked: qd 90-160 Current symptoms/problems include none at this time. Daily foot checks: yes   Any foot concerns: none at this time Last eye exam: next week having appt  Exercise: walking half an hour three times a week She continues on her eyedrops for glaucoma.  She also is taking simvastatin, metformin, lisinopril/HCTZ. The following portions of the patient's history were reviewed and updated as appropriate: allergies, current medications, past medical history, past social history and problem list. Recent mammogram was negative ROS as in subjective above.     Objective:    Physical Exam Alert and in no distress foot exam is negative.   Lab Review Diabetic Labs Latest Ref Rng & Units 10/11/2018 03/21/2018 01/03/2018 07/15/2017 11/30/2016  HbA1c 4.0 - 5.6 % 7.2(A) - 8.3 7.5 -  Microalbumin mg/L - - 14.2 - -  Micro/Creat Ratio - - - 6.7 - -  Chol 100 - 199 mg/dL - - 204(H) - -  HDL >39 mg/dL - - 56 - -  Calc LDL 0 - 99 mg/dL - - 118(H) - -  Triglycerides 0 - 149 mg/dL - - 152(H) - -  Creatinine 0.44 - 1.00 mg/dL - 0.58 0.65 - 0.61   BP/Weight 02/14/2019 10/11/2018 03/21/2018 03/07/2018 0/86/7619  Systolic BP 509 326 712 458 099  Diastolic BP 92 92 78 88 82  Wt. (Lbs) 188.6 188.6 - 184.2 184.4  BMI 31.87 31.87 - 31.13 31.65   Foot/eye exam completion dates Latest Ref Rng & Units 02/14/2019 10/31/2015  Eye Exam No Retinopathy - No Retinopathy  Foot Form Completion - Done -  A1c is 8.1 Recent mammogram was negative [name]  reports that she has never smoked. She has never used smokeless tobacco. She reports that she does not drink alcohol or use drugs.     Assessment &  Plan:    Controlled diabetes mellitus type 2 with complications, unspecified whether long term insulin use (HCC) - Plan: canagliflozin (INVOKANA) 300 MG TABS tablet, CBC with Differential/Platelet, Comprehensive metabolic panel, Lipid panel, POCT UA - Microalbumin  Cataract associated with type 2 diabetes mellitus (HCC)  Glaucoma of both eyes, unspecified glaucoma type  Hyperlipidemia associated with type 2 diabetes mellitus ([place]) - Plan: Lipid panel  Hypertension associated with diabetes ([place]) - Plan: amLODipine (NORVASC) 5 MG tablet, CBC with Differential/Platelet, Comprehensive metabolic panel  Estrogen deficiency - Plan: DG Bone Density   1. Rx changes: Invokana and amlodipine 2. Education: Reviewed 'ABCs' of diabetes management (respective goals in parentheses):  A1C (<7), blood pressure (<130/80), and cholesterol (LDL <100). 3. Compliance at present is estimated to be fair. Efforts to improve compliance (if necessary) will be directed at increased exercise. 4. Follow up: 4 months I discussed the use of Invokana with her and possible side effects.  She will let me know if she has any troubles with that.  We will also add amlodipine to her regimen to get her pressure under better control.  Continue on her other medications. She is to return here in 4 months for recheck and also Pap and pelvic.

## 2019-02-15 LAB — COMPREHENSIVE METABOLIC PANEL
ALT: 22 IU/L (ref 0–32)
AST: 15 IU/L (ref 0–40)
Albumin/Globulin Ratio: 1.5 (ref 1.2–2.2)
Albumin: 4.4 g/dL (ref 3.8–4.8)
Alkaline Phosphatase: 79 IU/L (ref 39–117)
BUN/Creatinine Ratio: 19 (ref 12–28)
BUN: 12 mg/dL (ref 8–27)
Bilirubin Total: 0.3 mg/dL (ref 0.0–1.2)
CO2: 25 mmol/L (ref 20–29)
Calcium: 9.7 mg/dL (ref 8.7–10.3)
Chloride: 102 mmol/L (ref 96–106)
Creatinine, Ser: 0.62 mg/dL (ref 0.57–1.00)
GFR calc Af Amer: 109 mL/min/{1.73_m2} (ref >59)
GFR calc non Af Amer: 95 mL/min/{1.73_m2} (ref >59)
Globulin, Total: 3 g/dL (ref 1.5–4.5)
Glucose: 94 mg/dL (ref 65–99)
Potassium: 5 mmol/L (ref 3.5–5.2)
Sodium: 138 mmol/L (ref 134–144)
Total Protein: 7.4 g/dL (ref 6.0–8.5)

## 2019-02-15 LAB — LIPID PANEL
Chol/HDL Ratio: 3.7 ratio (ref 0.0–4.4)
Cholesterol, Total: 209 mg/dL — ABNORMAL HIGH (ref 100–199)
HDL: 56 mg/dL (ref >39)
LDL Calculated: 114 mg/dL — ABNORMAL HIGH (ref 0–99)
Triglycerides: 196 mg/dL — ABNORMAL HIGH (ref 0–149)
VLDL Cholesterol Cal: 39 mg/dL (ref 5–40)

## 2019-02-15 LAB — CBC WITH DIFFERENTIAL/PLATELET
Basophils Absolute: 0.1 10*3/uL (ref 0.0–0.2)
Basos: 1 %
EOS (ABSOLUTE): 0.3 10*3/uL (ref 0.0–0.4)
Eos: 5 %
Hematocrit: 37.2 % (ref 34.0–46.6)
Hemoglobin: 12.6 g/dL (ref 11.1–15.9)
Immature Grans (Abs): 0 10*3/uL (ref 0.0–0.1)
Immature Granulocytes: 0 %
Lymphocytes Absolute: 2.4 10*3/uL (ref 0.7–3.1)
Lymphs: 46 %
MCH: 29 pg (ref 26.6–33.0)
MCHC: 33.9 g/dL (ref 31.5–35.7)
MCV: 86 fL (ref 79–97)
Monocytes Absolute: 0.5 10*3/uL (ref 0.1–0.9)
Monocytes: 10 %
Neutrophils Absolute: 2 10*3/uL (ref 1.4–7.0)
Neutrophils: 38 %
Platelets: 291 10*3/uL (ref 150–450)
RBC: 4.34 x10E6/uL (ref 3.77–5.28)
RDW: 13.5 % (ref 11.7–15.4)
WBC: 5.3 10*3/uL (ref 3.4–10.8)

## 2019-02-15 MED ORDER — SIMVASTATIN 40 MG PO TABS
40.0000 mg | ORAL_TABLET | Freq: Every day | ORAL | 3 refills | Status: DC
Start: 2019-02-15 — End: 2020-06-07

## 2019-02-15 NOTE — Addendum Note (Signed)
Addended by: Denita Lung on: 02/15/2019 07:55 AM   Modules accepted: Orders

## 2019-03-22 DIAGNOSIS — H401134 Primary open-angle glaucoma, bilateral, indeterminate stage: Secondary | ICD-10-CM | POA: Diagnosis not present

## 2019-03-22 DIAGNOSIS — E119 Type 2 diabetes mellitus without complications: Secondary | ICD-10-CM | POA: Diagnosis not present

## 2019-03-22 DIAGNOSIS — Z961 Presence of intraocular lens: Secondary | ICD-10-CM | POA: Diagnosis not present

## 2019-04-27 ENCOUNTER — Other Ambulatory Visit: Payer: Self-pay | Admitting: Family Medicine

## 2019-04-27 DIAGNOSIS — I152 Hypertension secondary to endocrine disorders: Secondary | ICD-10-CM

## 2019-04-27 DIAGNOSIS — E1159 Type 2 diabetes mellitus with other circulatory complications: Secondary | ICD-10-CM

## 2019-05-23 ENCOUNTER — Encounter: Payer: Self-pay | Admitting: Family Medicine

## 2019-05-23 ENCOUNTER — Ambulatory Visit (INDEPENDENT_AMBULATORY_CARE_PROVIDER_SITE_OTHER): Payer: PPO | Admitting: Family Medicine

## 2019-05-23 ENCOUNTER — Other Ambulatory Visit: Payer: Self-pay

## 2019-05-23 VITALS — BP 148/90 | HR 68 | Temp 97.3°F | Ht 64.5 in | Wt 182.0 lb

## 2019-05-23 DIAGNOSIS — I10 Essential (primary) hypertension: Secondary | ICD-10-CM

## 2019-05-23 DIAGNOSIS — Z23 Encounter for immunization: Secondary | ICD-10-CM | POA: Diagnosis not present

## 2019-05-23 DIAGNOSIS — E118 Type 2 diabetes mellitus with unspecified complications: Secondary | ICD-10-CM | POA: Diagnosis not present

## 2019-05-23 DIAGNOSIS — E1159 Type 2 diabetes mellitus with other circulatory complications: Secondary | ICD-10-CM | POA: Diagnosis not present

## 2019-05-23 DIAGNOSIS — E1169 Type 2 diabetes mellitus with other specified complication: Secondary | ICD-10-CM | POA: Diagnosis not present

## 2019-05-23 DIAGNOSIS — E785 Hyperlipidemia, unspecified: Secondary | ICD-10-CM

## 2019-05-23 DIAGNOSIS — I152 Hypertension secondary to endocrine disorders: Secondary | ICD-10-CM

## 2019-05-23 DIAGNOSIS — N761 Subacute and chronic vaginitis: Secondary | ICD-10-CM | POA: Diagnosis not present

## 2019-05-23 LAB — POCT GLYCOSYLATED HEMOGLOBIN (HGB A1C): Hemoglobin A1C: 7.3 % — AB (ref 4.0–5.6)

## 2019-05-23 MED ORDER — FLUCONAZOLE 150 MG PO TABS: 150.0000 mg | ORAL_TABLET | Freq: Once | ORAL | 5 refills | Status: AC

## 2019-05-23 NOTE — Patient Instructions (Signed)
Use your bicycle 20 minutes every day

## 2019-05-23 NOTE — Progress Notes (Signed)
  Subjective:    Patient ID: [name], female    DOB: 09-26-1950, 68 y.o.   MRN: [id number]  Liza Derington is a 68 y.o. female who presents for follow-up of Type 2 diabetes mellitus.  Patient is checking home blood sugars.   Home blood sugar records: meter records How often is blood sugars being checked: 3-4 times a week 126-151 fasting and post meal Current symptoms/problems include medicine may be causing yeast infection. Daily foot checks: yes   Any foot concerns: none Last eye exam: Two months ago [place] Exercise: sometimes She notes some vaginal itching's especially since she has been on Invokana but is happy with the results of the [place].  She continues on simvastatin as well as metformin, lisinopril/HCTZ. The following portions of the patient's history were reviewed and updated as appropriate: allergies, current medications, past medical history, past social history and problem list.  ROS as in subjective above.     Objective:    Physical Exam Alert and in no distress otherwise not examined. Hemoglobin A1c is 7.3   Lab Review Diabetic Labs Latest Ref Rng & Units 02/14/2019 10/11/2018 03/21/2018 01/03/2018 07/15/2017  HbA1c 4.0 - 5.6 % 8.1(A) 7.2(A) - 8.3 7.5  Microalbumin mg/L <5.0 - - 14.2 -  Micro/Creat Ratio - <12.4 - - 6.7 -  Chol 100 - 199 mg/dL 209(H) - - 204(H) -  HDL >39 mg/dL 56 - - 56 -  Calc LDL 0 - 99 mg/dL 114(H) - - 118(H) -  Triglycerides 0 - 149 mg/dL 196(H) - - 152(H) -  Creatinine 0.57 - 1.00 mg/dL 0.62 - 0.58 0.65 -   BP/Weight 05/23/2019 02/14/2019 10/11/2018 [id number]  Systolic BP 123456 [id number] [id number] [id number] [id number]  Diastolic BP 90 92 92 78 88  Wt. (Lbs) 182 188.6 188.6 - 184.2  BMI 30.76 31.87 31.87 - 31.13   Foot/eye exam completion dates Latest Ref Rng & Units 02/14/2019 10/31/2015  Eye Exam No Retinopathy - No Retinopathy  Foot Form Completion - Done -    Breyonna  reports that she has never smoked. She has never used smokeless  tobacco. She reports that she does not drink alcohol or use drugs.     Assessment & Plan:    Controlled diabetes mellitus type 2 with complications, unspecified whether long term insulin use (HCC)  Hyperlipidemia associated with type 2 diabetes mellitus ([place]) - Plan: Lipid panel  Subacute vaginitis - Plan: fluconazole (DIFLUCAN) 150 MG tablet  Need for influenza vaccination - Plan: Flu Vaccine QUAD High Dose(Fluad)  Hypertension associated with diabetes ([place])    1. Rx changes: Diflucan     I explained that we might need to use the Diflucan intermittently.  2. Donate education: Reviewed 'ABCs' of diabetes management (respective goals in parentheses):  A1C (<7), blood pressure (<130/80), and cholesterol (LDL <100). 3. Compliance at present is estimated to be good. Efforts to improve compliance (if necessary) will be directed at increased exercise.  Recommended 20 minutes of something physical daily. 4. Follow up: 4 months

## 2019-05-24 LAB — LIPID PANEL
Chol/HDL Ratio: 2.4 ratio (ref 0.0–4.4)
Cholesterol, Total: 131 mg/dL (ref 100–199)
HDL: 54 mg/dL (ref >39)
LDL Chol Calc (NIH): 50 mg/dL (ref 0–99)
Triglycerides: 164 mg/dL — ABNORMAL HIGH (ref 0–149)
VLDL Cholesterol Cal: 27 mg/dL (ref 5–40)

## 2019-05-26 ENCOUNTER — Other Ambulatory Visit: Payer: PPO

## 2019-05-30 ENCOUNTER — Other Ambulatory Visit: Payer: Self-pay | Admitting: Internal Medicine

## 2019-05-30 DIAGNOSIS — I152 Hypertension secondary to endocrine disorders: Secondary | ICD-10-CM

## 2019-05-30 DIAGNOSIS — E1159 Type 2 diabetes mellitus with other circulatory complications: Secondary | ICD-10-CM

## 2019-05-30 DIAGNOSIS — E118 Type 2 diabetes mellitus with unspecified complications: Secondary | ICD-10-CM

## 2019-05-30 MED ORDER — LISINOPRIL-HYDROCHLOROTHIAZIDE 20-12.5 MG PO TABS: 1.0000 | ORAL_TABLET | Freq: Every day | ORAL | 0 refills | Status: DC

## 2019-05-30 MED ORDER — METFORMIN HCL 1000 MG PO TABS: ORAL_TABLET | ORAL | 0 refills | Status: DC

## 2019-06-23 ENCOUNTER — Encounter: Payer: Self-pay | Admitting: Family Medicine

## 2019-07-20 ENCOUNTER — Encounter: Payer: Self-pay | Admitting: Physician Assistant

## 2019-07-28 ENCOUNTER — Encounter: Payer: Self-pay | Admitting: Physician Assistant

## 2019-07-28 ENCOUNTER — Ambulatory Visit (INDEPENDENT_AMBULATORY_CARE_PROVIDER_SITE_OTHER): Payer: PPO | Admitting: Physician Assistant

## 2019-07-28 ENCOUNTER — Ambulatory Visit (INDEPENDENT_AMBULATORY_CARE_PROVIDER_SITE_OTHER): Payer: PPO

## 2019-07-28 VITALS — BP 136/80 | HR 85 | Temp 97.6°F | Ht 66.0 in | Wt 187.0 lb

## 2019-07-28 DIAGNOSIS — Z1159 Encounter for screening for other viral diseases: Secondary | ICD-10-CM

## 2019-07-28 DIAGNOSIS — R1314 Dysphagia, pharyngoesophageal phase: Secondary | ICD-10-CM | POA: Diagnosis not present

## 2019-07-28 DIAGNOSIS — K219 Gastro-esophageal reflux disease without esophagitis: Secondary | ICD-10-CM

## 2019-07-28 DIAGNOSIS — R1084 Generalized abdominal pain: Secondary | ICD-10-CM

## 2019-07-28 MED ORDER — HYOSCYAMINE SULFATE 0.125 MG SL SUBL: 0.1250 mg | SUBLINGUAL_TABLET | SUBLINGUAL | 1 refills | Status: DC | PRN

## 2019-07-28 MED ORDER — OMEPRAZOLE 40 MG PO CPDR: 40.0000 mg | DELAYED_RELEASE_CAPSULE | Freq: Every day | ORAL | 3 refills | Status: DC

## 2019-07-28 NOTE — Progress Notes (Signed)
Chief Complaint: Abdominal pain  HPI:    [name] is a 68 year old Spanish-speaking female, interpreter present, with a past medical history as listed below, known to Dr. Carlean Purl, who was referred to me by Denita Lung, MD for a complaint of abdominal pain.      08/03/2016 colonoscopy for screening.  One 6 mm polyp in the descending colon, severe diverticulosis in the sigmoid and mild in the remainder of the entire examined colon.  Pathology revealed 6 mm adenoma.  Recall placed for 2022.    Today, the patient presents to clinic accompanied by an interpreter and explains that over the past 4 months she has been having some generalized abdominal pain which she feels "in the walls of my abdomen", this is typically worse after eating greasy food.  It can come on the same day shortly after eating or the next day, she will then decrease her diet over that day and it will slowly go away.  Describes normal bowel habits with soft formed stools every day.  Does explain that this pain is more of a cramping which comes and goes.  Did start Invokana 3 months ago, has decreased this to once a week.    Also describes symptoms of dysphagia today telling me that even water seems to get stuck in her throat or feels slow going down.  Also describes reflux and heartburn symptoms for which she chews Tums which only works for a short period of time.    Denies fever, chills, blood in her stool or symptoms that awaken her from sleep.     Past Medical History:  Diagnosis Date  . Allergic rhinitis   . Allergy    pollen   . Cataract   . Diabetes mellitus   . Diverticulosis of colon   . GERD (gastroesophageal reflux disease)   . Helicobacter pylori gastritis   . HLD (hyperlipidemia)   . Hx of adenomatous polyp of colon 08/06/2016  . Hypertension   . Obesity     Past Surgical History:  Procedure Laterality Date  . CATARACT EXTRACTION Bilateral   . COLONOSCOPY  04/08/2006; 07/2016  . UPPER GASTROINTESTINAL  ENDOSCOPY  06/16/2011   H. pylori gastritis with polyp and hyperakeratosis of esophagus  . VAGINAL HYSTERECTOMY  09/27/2006   and left paraovarian cystectomy    Current Outpatient Medications  Medication Sig Dispense Refill  . amLODipine (NORVASC) 5 MG tablet Take 1 tablet (5 mg total) by mouth daily. 90 tablet 3  . Blood Glucose Monitoring Suppl (CONTOUR BLOOD GLUCOSE SYSTEM) DEVI Test twice daily DX: E11.9 1 Device 0  . canagliflozin (INVOKANA) 300 MG TABS tablet Take 1 tablet (300 mg total) by mouth daily before breakfast. 30 tablet 5  . glucose blood test strip Use as directed twice daily 100 each 12  . lisinopril-hydrochlorothiazide (ZESTORETIC) 20-12.5 MG tablet Take 1 tablet by mouth daily. 90 tablet 0  . metFORMIN (GLUCOPHAGE) 1000 MG tablet TAKE 1 TABLET BY MOUTH TWICE DAILY WITH A MEAL 180 tablet 0  . Multiple Vitamins-Minerals (MULTIVITAMIN WITH MINERALS) tablet Take 1 tablet by mouth daily.      Glory Rosebush Delica Lancets [id number] MISC Use as directed twice daily 100 each 1  . simvastatin (ZOCOR) 40 MG tablet Take 1 tablet (40 mg total) by mouth at bedtime. 90 tablet 3  . albuterol (PROVENTIL HFA;VENTOLIN HFA) 108 (90 Base) MCG/ACT inhaler Inhale 2 puffs into the lungs every 6 (six) hours as needed for wheezing or shortness of breath. (  Patient not taking: Reported on 03/07/2018) 1 Inhaler 0  . dorzolamide-timolol (COSOPT) 22.3-6.8 MG/ML ophthalmic solution 1 drop 2 (two) times daily.     Current Facility-Administered Medications  Medication Dose Route Frequency Provider Last Rate Last Dose  . 0.9 %  sodium chloride infusion  500 mL Intravenous Continuous Gatha Mayer, MD        Allergies as of 07/28/2019 - Review Complete 07/28/2019  Allergen Reaction Noted  . Brimonidine Other (See Comments) 01/19/2014    Family History  Problem Relation Age of Onset  . Colon cancer Paternal Uncle   . Diabetes Mother   . Heart disease Mother   . Colon polyps Cousin   . Rectal cancer Neg Hx    . Stomach cancer Neg Hx   . Esophageal cancer Neg Hx     Social History   Socioeconomic History  . Marital status: Single    Spouse name: Not on file  . Number of children: 2  . Years of education: Not on file  . Highest education level: Not on file  Occupational History  . Occupation: Radiation protection practitioner  Social Needs  . Financial resource strain: Not on file  . Food insecurity    Worry: Not on file    Inability: Not on file  . Transportation needs    Medical: Not on file    Non-medical: Not on file  Tobacco Use  . Smoking status: Never Smoker  . Smokeless tobacco: Never Used  Substance and Sexual Activity  . Alcohol use: No  . Drug use: No  . Sexual activity: Not Currently  Lifestyle  . Physical activity    Days per week: Not on file    Minutes per session: Not on file  . Stress: Not on file  Relationships  . Social Herbalist on phone: Not on file    Gets together: Not on file    Attends religious service: Not on file    Active member of club or organization: Not on file    Attends meetings of clubs or organizations: Not on file    Relationship status: Not on file  . Intimate partner violence    Fear of current or ex partner: Not on file    Emotionally abused: Not on file    Physically abused: Not on file    Forced sexual activity: Not on file  Other Topics Concern  . Not on file  Social History Narrative   2 caffeine drinks daily     Review of Systems:    Constitutional: No weight loss, fever or chills Cardiovascular: No chest pain   Respiratory: No SOB Gastrointestinal: See HPI and otherwise negative   Physical Exam:  Vital signs: BP 136/80   Pulse 85   Temp 97.6 F (36.4 C)   Ht 5\' 6"  (1.676 m)   Wt 187 lb (84.8 kg)   BMI 30.18 kg/m   Constitutional:   Pleasant Hispanic female appears to be in NAD, Well developed, Well nourished, alert and cooperative Respiratory: Respirations even and unlabored. Lungs clear to auscultation  bilaterally.   No wheezes, crackles, or rhonchi.  Cardiovascular: Normal S1, S2. No MRG. Regular rate and rhythm. No peripheral edema, cyanosis or pallor.  Gastrointestinal:  Soft, nondistended, mild epigastric ttp. No rebound or guarding. Normal bowel sounds. No appreciable masses or hepatomegaly. Rectal:  Not performed.  Psychiatric: Demonstrates good judgement and reason without abnormal affect or behaviors.  RELEVANT LABS AND IMAGING: CBC  Component Value Date/Time   WBC 5.3 02/14/2019 1127   WBC 6.7 03/21/2018 1157   RBC 4.34 02/14/2019 1127   RBC 4.53 03/21/2018 1157   HGB 12.6 02/14/2019 1127   HCT 37.2 02/14/2019 1127   PLT 291 02/14/2019 1127   MCV 86 02/14/2019 1127   MCH 29.0 02/14/2019 1127   MCH 28.5 03/21/2018 1157   MCHC 33.9 02/14/2019 1127   MCHC 31.1 03/21/2018 1157   RDW 13.5 02/14/2019 1127   LYMPHSABS 2.4 02/14/2019 1127   MONOABS 806 11/30/2016 1344   EOSABS 0.3 02/14/2019 1127   BASOSABS 0.1 02/14/2019 1127    CMP     Component Value Date/Time   NA 138 02/14/2019 1127   K 5.0 02/14/2019 1127   CL 102 02/14/2019 1127   CO2 25 02/14/2019 1127   GLUCOSE 94 02/14/2019 1127   GLUCOSE 93 03/21/2018 1157   BUN 12 02/14/2019 1127   CREATININE 0.62 02/14/2019 1127   CREATININE 0.61 11/30/2016 1344   CALCIUM 9.7 02/14/2019 1127   PROT 7.4 02/14/2019 1127   ALBUMIN 4.4 02/14/2019 1127   AST 15 02/14/2019 1127   ALT 22 02/14/2019 1127   ALKPHOS 79 02/14/2019 1127   BILITOT 0.3 02/14/2019 1127   GFRNONAA 95 02/14/2019 1127   GFRAA 109 02/14/2019 1127    Assessment: 1.  Dysphagia: Even with water at times over the past 4 to 6 months, also with reflux and epigastric pain ; consider esophageal stricture versus ring versus esophagitis versus other  2.  Generalized abdominal pain: After eating greasy foods, felt all over the abdomen, can come on that day or the next, no change in bowel habits consider IBS 3.  GERD  Plan: 1.  Prescribed Omeprazole 40 mg  daily, 30-60 minutes before eating breakfast #30 with 3 refills 2.  Reviewed antireflux diet and lifestyle modifications. 3.  Reviewed antidysphagia measures. 4.  Scheduled patient for an EGD with possible dilation in the [place] with Dr. Carlean Purl.  Did discuss risks, benefits, limitations and alternatives and patient agrees to proceed.  Patient will be Covid tested 2 days prior time procedure. 5.  Discussed with patient that she should follow-up with her PCP in regards to [facility] as she is not taking this how prescribed 6.  Prescribed Hyoscyamine sulfate 0.125 mg sublingual tabs every 4-6 hours as needed for generalized abdominal cramping #30 with 2 refills 7.  Patient to follow in clinic per recommendations from Dr. Carlean Purl after time of procedure.  Ellouise Newer, PA-C [facility] 07/28/2019, 9:29 AM  Cc: Denita Lung, MD

## 2019-07-28 NOTE — Patient Instructions (Addendum)
If you are age 66 or older, your body mass index should be between 23-30. Your Body mass index is 30.18 kg/m. If this is out of the aforementioned range listed, please consider follow up with your Primary Care Provider.  If you are age 62 or younger, your body mass index should be between 19-25. Your Body mass index is 30.18 kg/m. If this is out of the aformentioned range listed, please consider follow up with your Primary Care Provider.   You have been scheduled for an endoscopy. Please follow written instructions given to you at your visit today. If you use inhalers (even only as needed), please bring them with you on the day of your procedure.  We have sent the following medications to your pharmacy for you to pick up at your convenience:  1. Omeprazole 40 mg daily take 30-60 minutes before breakfast.   2. Hyoscyamine 0.125 mg SL tablet take as needed for abdominal pain.    Follow up with your primary care doctor about your Invokana.

## 2019-07-31 ENCOUNTER — Ambulatory Visit (AMBULATORY_SURGERY_CENTER): Payer: PPO | Admitting: Internal Medicine

## 2019-07-31 ENCOUNTER — Other Ambulatory Visit (INDEPENDENT_AMBULATORY_CARE_PROVIDER_SITE_OTHER): Payer: PPO

## 2019-07-31 ENCOUNTER — Encounter: Payer: Self-pay | Admitting: Internal Medicine

## 2019-07-31 ENCOUNTER — Other Ambulatory Visit: Payer: Self-pay

## 2019-07-31 VITALS — BP 122/71 | HR 66 | Temp 98.3°F | Resp 20 | Ht 66.0 in | Wt 187.0 lb

## 2019-07-31 DIAGNOSIS — R1314 Dysphagia, pharyngoesophageal phase: Secondary | ICD-10-CM

## 2019-07-31 DIAGNOSIS — R499 Unspecified voice and resonance disorder: Secondary | ICD-10-CM

## 2019-07-31 DIAGNOSIS — R1310 Dysphagia, unspecified: Secondary | ICD-10-CM | POA: Diagnosis not present

## 2019-07-31 LAB — SARS CORONAVIRUS 2 (TAT 6-24 HRS): SARS Coronavirus 2: NEGATIVE

## 2019-07-31 LAB — TSH: TSH: 0.83 u[IU]/mL (ref 0.35–4.50)

## 2019-07-31 MED ORDER — SODIUM CHLORIDE 0.9 % IV SOLN: 500.0000 mL | Freq: Once | INTRAVENOUS | Status: DC

## 2019-07-31 NOTE — Progress Notes (Signed)
Called to room to assist during endoscopic procedure.  Patient ID and intended procedure confirmed with present staff. Received instructions for my participation in the procedure from the performing physician.  

## 2019-07-31 NOTE — Op Note (Signed)
[place] Patient Name: [name] Procedure Date: 07/31/2019 2:35 PM MRN: [id number] Endoscopist: Gatha Mayer , MD Age: 68 Referring MD:  Date of Birth: 01/29/1951 Gender: Female Account #: [id number] Procedure:                Upper GI endoscopy Indications:              Dysphagia Medicines:                Propofol per Anesthesia, Monitored Anesthesia Care Procedure:                Pre-Anesthesia Assessment:                           - Prior to the procedure, a History and Physical                            was performed, and patient medications and                            allergies were reviewed. The patient's tolerance of                            previous anesthesia was also reviewed. The risks                            and benefits of the procedure and the sedation                            options and risks were discussed with the patient.                            All questions were answered, and informed consent                            was obtained. Prior Anticoagulants: The patient has                            taken no previous anticoagulant or antiplatelet                            agents. ASA Grade Assessment: III - A patient with                            severe systemic disease. After reviewing the risks                            and benefits, the patient was deemed in                            satisfactory condition to undergo the procedure.                           After obtaining informed consent, the endoscope was  passed under direct vision. Throughout the                            procedure, the patient's blood pressure, pulse, and                            oxygen saturations were monitored continuously. The                            Endoscope was introduced through the mouth, and                            advanced to the second part of duodenum. The upper                            GI endoscopy was  accomplished without difficulty.                            The patient tolerated the procedure well. Scope In: Scope Out: Findings:                 The examined esophagus was mildly tortuous.                           The exam was otherwise without abnormality.                           The cardia and gastric fundus were normal on                            retroflexion.                           The scope was withdrawn. Dilation was performed in                            the entire esophagus with a Maloney dilator with                            mild resistance at 54 Fr. Complications:            No immediate complications. Estimated Blood Loss:     Estimated blood loss: none. Estimated blood loss:                            none. Impression:               - Tortuous esophagus.                           - The examination was otherwise normal.                           - No specimens collected. Recommendation:           - Patient has a contact number available for  emergencies. The signs and symptoms of potential                            delayed complications were discussed with the                            patient. Return to normal activities tomorrow.                            Written discharge instructions were provided to the                            patient.                           - Clear liquids x 1 hour then soft foods rest of                            day. Start prior diet tomorrow.                           - Continue present medications.                           - stay on PPI                           check TSH - c/o voice change Gatha Mayer, MD 07/31/2019 3:10:05 PM This report has been signed electronically.

## 2019-07-31 NOTE — Progress Notes (Signed)
VS- [place]  Interpreter used today at the [employer] for this pt.  Interpreter's name is-  Database administrator

## 2019-07-31 NOTE — Patient Instructions (Addendum)
I did not see any problems here - I did stretch the esophagus to see if that helps you swallow better.  I did not see a cause for any voice change though sometimes reflux can cause it.  I am checking your thyroid function as that (if abnormal) could be related to a change in voice.  I appreciate the opportunity to care for you. Gatha Mayer, MD, [facility]   Discharge instructions given. Handout on a dilatation diet.  resume previous medications. Sent to lab after discharge. YOU HAD AN ENDOSCOPIC PROCEDURE TODAY AT [facility]:   Refer to the procedure report that was given to you for any specific questions about what was found during the examination.  If the procedure report does not answer your questions, please call your gastroenterologist to clarify.  If you requested that your care partner not be given the details of your procedure findings, then the procedure report has been included in a sealed envelope for you to review at your convenience later.  YOU SHOULD EXPECT: Some feelings of bloating in the abdomen. Passage of more gas than usual.  Walking can help get rid of the air that was put into your GI tract during the procedure and reduce the bloating. If you had a lower endoscopy (such as a colonoscopy or flexible sigmoidoscopy) you may notice spotting of blood in your stool or on the toilet paper. If you underwent a bowel prep for your procedure, you may not have a normal bowel movement for a few days.  Please Note:  You might notice some irritation and congestion in your nose or some drainage.  This is from the oxygen used during your procedure.  There is no need for concern and it should clear up in a day or so.  SYMPTOMS TO REPORT IMMEDIATELY:   Following upper endoscopy (EGD)  Vomiting of blood or coffee ground material  New chest pain or pain under the shoulder blades  Painful or persistently difficult swallowing  New shortness of breath  Fever of 100F or  higher  Black, tarry-looking stools  For urgent or emergent issues, a gastroenterologist can be reached at any hour by calling [phone].   DIET:  We do recommend a small meal at first, but then you may proceed to your regular diet.  Drink plenty of fluids but you should avoid alcoholic beverages for 24 hours.  ACTIVITY:  You should plan to take it easy for the rest of today and you should NOT DRIVE or use heavy machinery until tomorrow (because of the sedation medicines used during the test).    FOLLOW UP: Our staff will call the number listed on your records 48-72 hours following your procedure to check on you and address any questions or concerns that you may have regarding the information given to you following your procedure. If we do not reach you, we will leave a message.  We will attempt to reach you two times.  During this call, we will ask if you have developed any symptoms of COVID 19. If you develop any symptoms (ie: fever, flu-like symptoms, shortness of breath, cough etc.) before then, please call [phone].  If you test positive for Covid 19 in the 2 weeks post procedure, please call and report this information to [place].    If any biopsies were taken you will be contacted by phone or by letter within the next 1-3 weeks.  Please call us at [phone] if you have not  heard about the biopsies in 3 weeks.    SIGNATURES/CONFIDENTIALITY: You and/or your care partner have signed paperwork which will be entered into your electronic medical record.  These signatures attest to the fact that that the information above on your After Visit Summary has been reviewed and is understood.  Full responsibility of the confidentiality of this discharge information lies with you and/or your care-partner.

## 2019-07-31 NOTE — Progress Notes (Signed)
A and O x3. Report to RN. Tolerated MAC anesthesia well.Teeth unchanged after procedure.

## 2019-08-02 ENCOUNTER — Telehealth: Payer: Self-pay

## 2019-08-02 NOTE — Telephone Encounter (Signed)
  Follow up Call-  Call back number 07/31/2019  Post procedure Call Back phone  # [phone]  Permission to leave phone message Yes  Some recent data might be hidden     Patient questions:  Do you have a fever, pain , or abdominal swelling? No. Pain Score  0 *  Have you tolerated food without any problems? Yes.    Have you been able to return to your normal activities? Yes.    Do you have any questions about your discharge instructions: Diet   No. Medications  No. Follow up visit  No.  Do you have questions or concerns about your Care? No.  Actions: * If pain score is 4 or above: No action needed, pain <4.  1. Have you developed a fever since your procedure? no  2.   Have you had an respiratory symptoms (SOB or cough) since your procedure? no  3.   Have you tested positive for COVID 19 since your procedure no  4.   Have you had any family members/close contacts diagnosed with the COVID 19 since your procedure?  no   If yes to any of these questions please route to Joylene John, RN and Alphonsa Gin, Therapist, sports.

## 2019-08-06 NOTE — Progress Notes (Signed)
Thyroid test is normal  Please let her know - may need Spanish speaker  I cannot explain her voice change - needs to f/u PCP  If dysphagia not better she can let us know

## 2019-09-22 ENCOUNTER — Ambulatory Visit: Payer: PPO | Admitting: Family Medicine

## 2019-10-02 ENCOUNTER — Ambulatory Visit: Payer: PPO | Admitting: Family Medicine

## 2019-10-02 ENCOUNTER — Other Ambulatory Visit: Payer: Self-pay | Admitting: Family Medicine

## 2019-10-02 DIAGNOSIS — I152 Hypertension secondary to endocrine disorders: Secondary | ICD-10-CM

## 2019-10-02 DIAGNOSIS — Z03818 Encounter for observation for suspected exposure to other biological agents ruled out: Secondary | ICD-10-CM | POA: Diagnosis not present

## 2019-10-02 DIAGNOSIS — E1159 Type 2 diabetes mellitus with other circulatory complications: Secondary | ICD-10-CM

## 2019-10-02 NOTE — Progress Notes (Deleted)
  Subjective:    Patient ID: [name], female    DOB: [birth year], 69 y.o.   MRN: [id number]  [name] is a 69 y.o. female who presents for follow-up of Type 2 diabetes mellitus.  Home blood sugar records: {diabetes glucometry results:16657} Current symptoms/problems include {Symptoms; diabetes:14075} and have been {Desc; course:15616}. Daily foot checks:   Any foot concerns: *** Exercise: {types:19826} Diet: The following portions of the patient's history were reviewed and updated as appropriate: allergies, current medications, past medical history, past social history and problem list.  ROS as in subjective above.     Objective:    Physical Exam Alert and in no distress otherwise not examined.  There were no vitals taken for this visit.  Lab Review Diabetic Labs Latest Ref Rng & Units 05/23/2019 02/14/2019 10/11/2018 03/21/2018 01/03/2018  HbA1c 4.0 - 5.6 % 7.3(A) 8.1(A) 7.2(A) - 8.3  Microalbumin mg/L - <5.0 - - 14.2  Micro/Creat Ratio - - <12.4 - - 6.7  Chol 100 - 199 mg/dL 131 209(H) - - 204(H)  HDL >39 mg/dL 54 56 - - 56  Calc LDL 0 - 99 mg/dL 50 114(H) - - 118(H)  Triglycerides 0 - 149 mg/dL 164(H) 196(H) - - 152(H)  Creatinine 0.57 - 1.00 mg/dL - 0.62 - 0.58 0.65   BP/Weight 07/31/2019 07/28/2019 05/23/2019 [id number]  Systolic BP [id number] 123456 XX123456 A999333  Diastolic BP 71 80 90 92 92  Wt. (Lbs) 187 187 182 188.6 188.6  BMI 30.18 30.18 30.76 31.87 31.87   Foot/eye exam completion dates Latest Ref Rng & Units 02/14/2019 10/31/2015  Eye Exam No Retinopathy - No Retinopathy  Foot Form Completion - Done -    [name]  reports that she has never smoked. She has never used smokeless tobacco. She reports that she does not drink alcohol or use drugs.     Assessment & Plan:    No diagnosis found.  1. Rx changes: {none:33079} 2. Education: Reviewed 'ABCs' of diabetes management (respective goals in parentheses):  A1C (<7), blood pressure (<130/80), and  cholesterol (LDL <100). 3. Compliance at present is estimated to be {good/fair/poor:33178}. Efforts to improve compliance (if necessary) will be directed at {compliance:16716}. 4. Follow up: {NUMBERS; 0-10:33138} {time:11}

## 2019-10-03 ENCOUNTER — Other Ambulatory Visit: Payer: Self-pay | Admitting: Family Medicine

## 2019-10-03 DIAGNOSIS — E118 Type 2 diabetes mellitus with unspecified complications: Secondary | ICD-10-CM

## 2019-10-10 ENCOUNTER — Other Ambulatory Visit: Payer: Self-pay

## 2019-10-10 ENCOUNTER — Ambulatory Visit (INDEPENDENT_AMBULATORY_CARE_PROVIDER_SITE_OTHER): Payer: PPO | Admitting: Family Medicine

## 2019-10-10 ENCOUNTER — Encounter: Payer: Self-pay | Admitting: Family Medicine

## 2019-10-10 VITALS — BP 132/90 | HR 69 | Temp 97.5°F | Wt 183.2 lb

## 2019-10-10 DIAGNOSIS — I10 Essential (primary) hypertension: Secondary | ICD-10-CM | POA: Diagnosis not present

## 2019-10-10 DIAGNOSIS — I152 Hypertension secondary to endocrine disorders: Secondary | ICD-10-CM

## 2019-10-10 DIAGNOSIS — Z6830 Body mass index (BMI) 30.0-30.9, adult: Secondary | ICD-10-CM

## 2019-10-10 DIAGNOSIS — E785 Hyperlipidemia, unspecified: Secondary | ICD-10-CM | POA: Diagnosis not present

## 2019-10-10 DIAGNOSIS — E1169 Type 2 diabetes mellitus with other specified complication: Secondary | ICD-10-CM | POA: Diagnosis not present

## 2019-10-10 DIAGNOSIS — E1159 Type 2 diabetes mellitus with other circulatory complications: Secondary | ICD-10-CM

## 2019-10-10 DIAGNOSIS — E669 Obesity, unspecified: Secondary | ICD-10-CM

## 2019-10-10 DIAGNOSIS — E118 Type 2 diabetes mellitus with unspecified complications: Secondary | ICD-10-CM | POA: Diagnosis not present

## 2019-10-10 LAB — POCT GLYCOSYLATED HEMOGLOBIN (HGB A1C): Hemoglobin A1C: 7.9 % — AB (ref 4.0–5.6)

## 2019-10-10 NOTE — Progress Notes (Signed)
  Subjective:    Patient ID: [name], female    DOB: Jan 06, 1951, 69 y.o.   MRN: [id number]  Devena Arceneaux is a 69 y.o. female who presents for follow-up of Type 2 diabetes mellitus. Her interpreter, Reubin Milan was present. Home blood sugar records: 137 fasting meter record Current symptoms/problems include none at this time. Daily foot checks:yes  Any foot concerns: none Exercise: qd bike 20-30 min Diet: regular Her son died recently in a motorcycle accident.  She is still grieving over this.  She continues on Metformin without difficulty.  She is also taking lisinopril/hydrochlorothiazide and amlodipine..  She continues on simvastatin The following portions of the patient's history were reviewed and updated as appropriate: allergies, current medications, past medical history, past social history and problem list.  ROS as in subjective above.     Objective:    Physical Exam Alert and in no distress otherwise not examined. Hemoglobin A1c is 7.9 Lab Review Diabetic Labs Latest Ref Rng & Units 05/23/2019 02/14/2019 10/11/2018 03/21/2018 01/03/2018  HbA1c 4.0 - 5.6 % 7.3(A) 8.1(A) 7.2(A) - 8.3  Microalbumin mg/L - <5.0 - - 14.2  Micro/Creat Ratio - - <12.4 - - 6.7  Chol 100 - 199 mg/dL 131 209(H) - - 204(H)  HDL >39 mg/dL 54 56 - - 56  Calc LDL 0 - 99 mg/dL 50 114(H) - - 118(H)  Triglycerides 0 - 149 mg/dL 164(H) 196(H) - - 152(H)  Creatinine 0.57 - 1.00 mg/dL - 0.62 - 0.58 0.65   BP/Weight 07/31/2019 07/28/2019 05/23/2019 [id number]  Systolic BP 123XX123 [id number] 123456 XX123456 [id number]  Diastolic BP 71 80 90 92 92  Wt. (Lbs) 187 187 182 188.6 188.6  BMI 30.18 30.18 30.76 31.87 31.87   Foot/eye exam completion dates Latest Ref Rng & Units 02/14/2019 10/31/2015  Eye Exam No Retinopathy - No Retinopathy  Foot Form Completion - Done -    Arha  reports that she has never smoked. She has never used smokeless tobacco. She reports that she does not drink alcohol or use drugs.    Assessment & Plan:    Class 1 obesity with serious comorbidity and body mass index (BMI) of 30.0 to 30.9 in adult, unspecified obesity type  Hypertension associated with diabetes ([place])  Hyperlipidemia associated with type 2 diabetes mellitus ([place])  Controlled diabetes mellitus type 2 with complications, unspecified whether long term insulin use ([place]) - Plan: POCT glycosylated hemoglobin (Hb A1C)   1. Rx changes: none 2. Education: Reviewed 'ABCs' of diabetes management (respective goals in parentheses):  A1C (<7), blood pressure (<130/80), and cholesterol (LDL <100). 3. Compliance at present is estimated to be fair. Efforts to improve compliance (if necessary) will be directed at increased exercise. 4. Follow up: 4 months The majority of the time was spent counseling concerning bereavement.  Strongly encouraged her to allow her self to have all the feeling she is having and I also recommended that she follow-up with hospice.  The phone number was given.  She can certainly do a better job of taking care of her diabetes but under the circumstances I did not push the issue.

## 2019-10-10 NOTE — Patient Instructions (Signed)
Call Hospice [phone] Usual bicycle daily for least 20 minutes or go for a walk at least 20 minutes every day

## 2019-10-24 ENCOUNTER — Other Ambulatory Visit: Payer: Self-pay | Admitting: Physician Assistant

## 2019-11-27 DIAGNOSIS — Z961 Presence of intraocular lens: Secondary | ICD-10-CM | POA: Diagnosis not present

## 2019-11-27 DIAGNOSIS — H401134 Primary open-angle glaucoma, bilateral, indeterminate stage: Secondary | ICD-10-CM | POA: Diagnosis not present

## 2020-01-25 ENCOUNTER — Other Ambulatory Visit: Payer: Self-pay | Admitting: Family Medicine

## 2020-01-25 DIAGNOSIS — I152 Hypertension secondary to endocrine disorders: Secondary | ICD-10-CM

## 2020-01-25 DIAGNOSIS — E1159 Type 2 diabetes mellitus with other circulatory complications: Secondary | ICD-10-CM

## 2020-02-07 ENCOUNTER — Encounter: Payer: Self-pay | Admitting: Family Medicine

## 2020-02-07 ENCOUNTER — Ambulatory Visit (INDEPENDENT_AMBULATORY_CARE_PROVIDER_SITE_OTHER): Payer: PPO | Admitting: Family Medicine

## 2020-02-07 VITALS — BP 124/82 | HR 74 | Temp 97.7°F | Wt 186.4 lb

## 2020-02-07 DIAGNOSIS — E118 Type 2 diabetes mellitus with unspecified complications: Secondary | ICD-10-CM | POA: Diagnosis not present

## 2020-02-07 DIAGNOSIS — E66811 Obesity, class 1: Secondary | ICD-10-CM

## 2020-02-07 DIAGNOSIS — Z6830 Body mass index (BMI) 30.0-30.9, adult: Secondary | ICD-10-CM

## 2020-02-07 DIAGNOSIS — E785 Hyperlipidemia, unspecified: Secondary | ICD-10-CM

## 2020-02-07 DIAGNOSIS — E1159 Type 2 diabetes mellitus with other circulatory complications: Secondary | ICD-10-CM | POA: Diagnosis not present

## 2020-02-07 DIAGNOSIS — E1169 Type 2 diabetes mellitus with other specified complication: Secondary | ICD-10-CM

## 2020-02-07 DIAGNOSIS — M25561 Pain in right knee: Secondary | ICD-10-CM

## 2020-02-07 DIAGNOSIS — K219 Gastro-esophageal reflux disease without esophagitis: Secondary | ICD-10-CM | POA: Diagnosis not present

## 2020-02-07 DIAGNOSIS — I10 Essential (primary) hypertension: Secondary | ICD-10-CM

## 2020-02-07 DIAGNOSIS — E669 Obesity, unspecified: Secondary | ICD-10-CM

## 2020-02-07 DIAGNOSIS — I152 Hypertension secondary to endocrine disorders: Secondary | ICD-10-CM

## 2020-02-07 LAB — POCT GLYCOSYLATED HEMOGLOBIN (HGB A1C): Hemoglobin A1C: 8.4 % — AB (ref 4.0–5.6)

## 2020-02-07 NOTE — Progress Notes (Signed)
Subjective:    Patient ID: [name], female    DOB: [birth year], 69 y.o.   MRN: [id number]  [name] is a 69 y.o. female who presents for follow-up of Type 2 diabetes mellitus.  Home blood sugar records: meter record fasting and post meal 120-159 Current symptoms/problems include none at this time . Daily foot checks: yes   Any foot concerns: none Exercise: not much Diet: fair She continues on lisinopril/HCTZ as well as amlodipine and having no difficulty with that.  She is on Metformin.  She in the past taking Invokana but ran into difficulties with vaginitis and is no longer taking it.  She takes simvastatin without trouble.  She continues on an as-needed basis use of Prilosec for her reflux.  She is notes not exercising regularly.  She states that she noted the onset of right knee pain and swelling yesterday.  No history of injury, popping, locking, grinding, past history of difficulty with that.  No other joints are involved. The following portions of the patient's history were reviewed and updated as appropriate: allergies, current medications, past medical history, past social history and problem list.  ROS as in subjective above.     Objective:    Physical Exam Alert and in no distress.  Right knee exam shows a moderate effusion.  It is not hot or tender.  Ligaments are intact.  McMurray's testing negative.  No other joints are noted to be swollen.  Lab Review Diabetic Labs Latest Ref Rng & Units 10/10/2019 05/23/2019 02/14/2019 10/11/2018 03/21/2018  HbA1c 4.0 - 5.6 % 7.9(A) 7.3(A) 8.1(A) 7.2(A) -  Microalbumin mg/L - - <5.0 - -  Micro/Creat Ratio - - - <12.4 - -  Chol 100 - 199 mg/dL - 131 209(H) - -  HDL >39 mg/dL - 54 56 - -  Calc LDL 0 - 99 mg/dL - 50 114(H) - -  Triglycerides 0 - 149 mg/dL - 164(H) 196(H) - -  Creatinine 0.57 - 1.00 mg/dL - - 0.62 - 0.58   BP/Weight 10/10/2019 07/31/2019 07/28/2019 [id number] A999333  Systolic BP [id number] 123XX123 XX123456 123456 XX123456    Diastolic BP 90 71 80 90 92  Wt. (Lbs) 183.2 187 187 182 188.6  BMI 29.57 30.18 30.18 30.76 31.87   Foot/eye exam completion dates Latest Ref Rng & Units 02/14/2019 10/31/2015  Eye Exam No Retinopathy - No Retinopathy  Foot Form Completion - Done -   Hemoglobin A1c is now 8.4 Rosetter  reports that she has never smoked. She has never used smokeless tobacco. She reports that she does not drink alcohol or use drugs.     Assessment & Plan:    Class 1 obesity with serious comorbidity and body mass index (BMI) of 30.0 to 30.9 in adult, unspecified obesity type  Hypertension associated with diabetes ([place])  Hyperlipidemia associated with type 2 diabetes mellitus ([place])  Gastroesophageal reflux disease without esophagitis  Controlled diabetes mellitus type 2 with complications, unspecified whether long term insulin use ([facility])  Acute pain of right knee   1. Rx changes: I will add Actos to her regimen. 2. Education: Reviewed 'ABCs' of diabetes management (respective goals in parentheses):  A1C (<7), blood pressure (<130/80), and cholesterol (LDL <100). 3. Compliance at present is estimated to be fair. Efforts to improve compliance (if necessary) will be directed at increased exercise.  As well as making further dietary changes specifically cutting back on carbohydrates. 4. Follow up: 4 months Discussed evaluating the effusion with draining some  fluid off although she decides she does not want that done.  She is to take 2 Aleve twice per day over the next 10 days to see if this will quiet down.  If it does not she should return here for further evaluation. Encouraged her to continue on her present medications and also use Prilosec on an as-needed basis which she has now doing.

## 2020-02-07 NOTE — Patient Instructions (Signed)
Take 2 Aleve twice per day for the next 10 days and see if we can make this go away.  If it doesn't then I want you back in here so we can get some fluid off of that Don't hesitate to call hospice to get some help with dealing with the death of your son [phone]

## 2020-03-31 ENCOUNTER — Other Ambulatory Visit: Payer: Self-pay | Admitting: Family Medicine

## 2020-03-31 DIAGNOSIS — I152 Hypertension secondary to endocrine disorders: Secondary | ICD-10-CM

## 2020-04-01 ENCOUNTER — Other Ambulatory Visit: Payer: Self-pay

## 2020-04-01 ENCOUNTER — Telehealth: Payer: Self-pay

## 2020-04-01 DIAGNOSIS — I152 Hypertension secondary to endocrine disorders: Secondary | ICD-10-CM

## 2020-04-01 MED ORDER — AMLODIPINE BESYLATE 5 MG PO TABS: 5.0000 mg | ORAL_TABLET | Freq: Every day | ORAL | 3 refills | Status: DC

## 2020-04-01 NOTE — Telephone Encounter (Signed)
Done KH 

## 2020-04-01 NOTE — Telephone Encounter (Signed)
Received fax from [facility] stating pt. Needs a refill on Norvasc pt. Last apt was 02/07/20 and next apt is 06/07/20

## 2020-04-02 ENCOUNTER — Other Ambulatory Visit: Payer: Self-pay

## 2020-04-23 ENCOUNTER — Other Ambulatory Visit: Payer: Self-pay | Admitting: Family Medicine

## 2020-04-23 DIAGNOSIS — Z0000 Encounter for general adult medical examination without abnormal findings: Secondary | ICD-10-CM

## 2020-05-09 ENCOUNTER — Other Ambulatory Visit: Payer: Self-pay

## 2020-05-09 ENCOUNTER — Ambulatory Visit
Admission: RE | Admit: 2020-05-09 | Discharge: 2020-05-09 | Disposition: A | Discharge status: Home / Self Care | Payer: PPO | Source: Ambulatory Visit | Attending: Family Medicine | Admitting: Family Medicine

## 2020-05-09 DIAGNOSIS — Z1231 Encounter for screening mammogram for malignant neoplasm of breast: Secondary | ICD-10-CM | POA: Diagnosis not present

## 2020-05-09 DIAGNOSIS — Z0000 Encounter for general adult medical examination without abnormal findings: Secondary | ICD-10-CM

## 2020-06-03 DIAGNOSIS — H401134 Primary open-angle glaucoma, bilateral, indeterminate stage: Secondary | ICD-10-CM | POA: Diagnosis not present

## 2020-06-03 DIAGNOSIS — E119 Type 2 diabetes mellitus without complications: Secondary | ICD-10-CM | POA: Diagnosis not present

## 2020-06-03 DIAGNOSIS — H26491 Other secondary cataract, right eye: Secondary | ICD-10-CM | POA: Diagnosis not present

## 2020-06-03 DIAGNOSIS — Z961 Presence of intraocular lens: Secondary | ICD-10-CM | POA: Diagnosis not present

## 2020-06-07 ENCOUNTER — Ambulatory Visit (INDEPENDENT_AMBULATORY_CARE_PROVIDER_SITE_OTHER): Payer: PPO | Admitting: Family Medicine

## 2020-06-07 ENCOUNTER — Other Ambulatory Visit: Payer: Self-pay

## 2020-06-07 ENCOUNTER — Encounter: Payer: Self-pay | Admitting: Family Medicine

## 2020-06-07 VITALS — BP 138/82 | HR 84 | Ht 66.0 in | Wt 188.2 lb

## 2020-06-07 DIAGNOSIS — E1159 Type 2 diabetes mellitus with other circulatory complications: Secondary | ICD-10-CM | POA: Diagnosis not present

## 2020-06-07 DIAGNOSIS — Z0000 Encounter for general adult medical examination without abnormal findings: Secondary | ICD-10-CM

## 2020-06-07 DIAGNOSIS — Z23 Encounter for immunization: Secondary | ICD-10-CM

## 2020-06-07 DIAGNOSIS — Z7189 Other specified counseling: Secondary | ICD-10-CM | POA: Diagnosis not present

## 2020-06-07 DIAGNOSIS — E118 Type 2 diabetes mellitus with unspecified complications: Secondary | ICD-10-CM

## 2020-06-07 DIAGNOSIS — E2839 Other primary ovarian failure: Secondary | ICD-10-CM | POA: Diagnosis not present

## 2020-06-07 DIAGNOSIS — E785 Hyperlipidemia, unspecified: Secondary | ICD-10-CM | POA: Diagnosis not present

## 2020-06-07 DIAGNOSIS — I152 Hypertension secondary to endocrine disorders: Secondary | ICD-10-CM

## 2020-06-07 DIAGNOSIS — K219 Gastro-esophageal reflux disease without esophagitis: Secondary | ICD-10-CM

## 2020-06-07 DIAGNOSIS — E669 Obesity, unspecified: Secondary | ICD-10-CM | POA: Diagnosis not present

## 2020-06-07 DIAGNOSIS — Z6830 Body mass index (BMI) 30.0-30.9, adult: Secondary | ICD-10-CM

## 2020-06-07 DIAGNOSIS — E1169 Type 2 diabetes mellitus with other specified complication: Secondary | ICD-10-CM

## 2020-06-07 DIAGNOSIS — Z86010 Personal history of colonic polyps: Secondary | ICD-10-CM

## 2020-06-07 LAB — POCT GLYCOSYLATED HEMOGLOBIN (HGB A1C): Hemoglobin A1C: 8.3 % — AB (ref 4.0–5.6)

## 2020-06-07 LAB — POCT UA - MICROALBUMIN
Albumin/Creatinine Ratio, Urine, POC: 8.3
Creatinine, POC: 63.9 mg/dL
Microalbumin Ur, POC: 5.3 mg/L

## 2020-06-07 MED ORDER — TRULICITY 0.75 MG/0.5ML ~~LOC~~ SOAJ: 0.7500 mg | SUBCUTANEOUS | 5 refills | Status: DC

## 2020-06-07 MED ORDER — LISINOPRIL-HYDROCHLOROTHIAZIDE 20-12.5 MG PO TABS: 1.0000 | ORAL_TABLET | Freq: Every day | ORAL | 3 refills | Status: DC

## 2020-06-07 MED ORDER — METFORMIN HCL 1000 MG PO TABS: ORAL_TABLET | ORAL | 1 refills | Status: DC

## 2020-06-07 MED ORDER — SIMVASTATIN 40 MG PO TABS: 40.0000 mg | ORAL_TABLET | Freq: Every day | ORAL | 3 refills | Status: DC

## 2020-06-07 NOTE — Progress Notes (Signed)
[name] is a 69 y.o. female who presents for annual wellness visit,CPE and follow-up on chronic medical conditions.  Her interpreter, Edwinna Areola was present.  She has underlying diabetes but stopped taking the Invokana because it caused difficulty with vaginitis.  She continues on Metformin without difficulty.  Her activity level is quite limited.  She has made very few change in her eating habits.  She is also taking lisinopril/HCTZ and amlodipine and having no difficulty with that.  She takes Prilosec on a daily basis.  She continues on simvastatin and is having no difficulty with that.  She did have the death of one of her sons less than a year ago and still has her quite distressed.  She does have history of colonic polyps and is scheduled for repeat colonoscopy next year.  She has no other concerns or complaints.  Family and social history was reviewed. Immunizations and Health Maintenance Immunization History  Administered Date(s) Administered   DTaP 02/10/2006   Fluad Quad(high Dose 65+) 05/23/2019   Influenza Split 08/03/2011   Influenza, High Dose Seasonal PF 10/11/2018   Influenza,inj,Quad PF,6+ Mos 07/04/2013, 06/05/2014, 06/29/2016, 07/15/2017   Moderna SARS-COVID-2 Vaccination 11/15/2019, 12/13/2019   Pneumococcal Conjugate-13 08/12/2015   Pneumococcal Polysaccharide-23 07/04/2013   Tdap 06/29/2016   Zoster 08/01/2014   Health Maintenance Due  Topic Date Due   DEXA SCAN  Never done   INFLUENZA VACCINE  04/07/2020    Last Pap smear:hysterectomy Last mammogram:05/09/2020 Last colonoscopy:08/03/2016 Last DEXA:n/a Dentist:n/a Ophtho:Rajiv Manuella Ghazi Exercise:ride a bike for about 30 minutes 2 days a week.   Other doctors caring for patient include:Dr. Carlean Purl [facility],   Advanced directives: Does Patient Have a Medical Advance Directive?: No Would patient like information on creating a medical advance directive?: Yes (ED - Information included in  AVS)  Depression screen:  See questionnaire below.  Depression screen [facility] 2/9 06/07/2020 01/03/2018 06/29/2016 08/01/2014  Decreased Interest 0 0 0 0  Down, Depressed, Hopeless 0 0 0 0  PHQ - 2 Score 0 0 0 0    Fall Risk Screen: see questionnaire below. Fall Risk  06/07/2020 06/29/2016  Falls in the past year? 0 No    ADL screen:  See questionnaire below Functional Status Survey: Is the patient deaf or have difficulty hearing?: Yes Does the patient have difficulty seeing, even when wearing glasses/contacts?: No Does the patient have difficulty concentrating, remembering, or making decisions?: No Does the patient have difficulty walking or climbing stairs?: No Does the patient have difficulty dressing or bathing?: No Does the patient have difficulty doing errands alone such as visiting a doctor's office or shopping?: No   Review of Systems Constitutional: -, -unexpected weight change, -anorexia, -fatigue Allergy: -sneezing, -itching, -congestion Dermatology: denies changing moles, rash, lumps ENT: -runny nose, -ear pain, -sore throat,  Cardiology:  -chest pain, -palpitations, -orthopnea, Respiratory: -cough, -shortness of breath, -dyspnea on exertion, -wheezing,  Gastroenterology: -abdominal pain, -nausea, -vomiting, -diarrhea, -constipation, -dysphagia Hematology: -bleeding or bruising problems Musculoskeletal: -arthralgias, -myalgias, -joint swelling, -back pain, - Ophthalmology: -vision changes,  Urology: -dysuria, -difficulty urinating,  -urinary frequency, -urgency, incontinence Neurology: -, -numbness, , -memory loss, -falls, -dizziness    PHYSICAL EXAM:  BP 138/82    Pulse 84    Ht 5\' 6"  (1.676 m)    Wt 188 lb 3.2 oz (85.4 kg)    SpO2 96%    BMI 30.38 kg/m   General Appearance: Alert, cooperative, no distress, appears stated age Head: Normocephalic, without obvious abnormality, atraumatic Eyes:  PERRL, conjunctiva/corneas clear, EOM's intact, fundi Ears: Normal TM's  and external ear canals Nose: Nares normal, mucosa normal, no drainage or sinus tenderness Throat: Lips, mucosa, and tongue normal; teeth and gums normal Neck: Supple, no lymphadenopathy;  thyroid:  no enlargement/tenderness/nodules; no carotid bruit or JVD Lungs: Clear to auscultation bilaterally without wheezes, rales or ronchi; respirations unlabored Heart: Regular rate and rhythm, S1 and S2 normal, no murmur, rubor gallop Abdomen: Soft, non-tender, nondistended, normoactive bowel sounds,  no masses, no hepatosplenomegaly Extremities: No clubbing, cyanosis or edema.  Foot exam is normal. Pulses: 2+ and symmetric all extremities Skin:  Skin color, texture, turgor normal, no rashes or lesions Lymph nodes: Cervical, supraclavicular, and axillary nodes normal Neurologic:  CNII-XII intact, normal strength, sensation and gait; reflexes 2+ and symmetric throughout Psych: Normal mood, affect, hygiene and grooming.  She did become tearful when discussing her son. Hemoglobin A1c is 8.3 ASSESSMENT/PLAN: Routine general medical examination at a health care facility - Plan: CBC with Differential/Platelet, Comprehensive metabolic panel, Lipid panel  Class 1 obesity with serious comorbidity and body mass index (BMI) of 30.0 to 30.9 in adult, unspecified obesity type  Hypertension associated with diabetes ([place]) - Plan: CBC with Differential/Platelet, Comprehensive metabolic panel  Hyperlipidemia associated with type 2 diabetes mellitus ([place]) - Plan: Lipid panel  Gastroesophageal reflux disease without esophagitis  Need for influenza vaccination - Plan: Flu Vaccine QUAD High Dose(Fluad), CANCELED: Flu vaccine HIGH DOSE PF (Fluzone High dose)  Estrogen deficiency - Plan: DG Bone Density  Hx of adenomatous polyp of colon  Bereavement counseling  Type 2 diabetes mellitus with other specified complication, without long-term current use of insulin ([place]) - Plan: HgB A1c, Dulaglutide (TRULICITY) 2.50  NL/9.7QB SOPN, CBC with Differential/Platelet, Comprehensive metabolic panel, Lipid panel, POCT UA - Microalbumin Discussed bereavement with her and encouraged her to call hospice for bereavement counseling.  Her interpreter will help her with that.   Immunization recommendations discussed.  Colonoscopy recommendations reviewed Follow-up with GI next year. Continue on present medication regimen. Medicare Attestation I have personally reviewed: The patient's medical and social history Their use of alcohol, tobacco or illicit drugs Their current medications and supplements The patient's functional ability including ADLs,fall risks, home safety risks, cognitive, and hearing and visual impairment Diet and physical activities Evidence for depression or mood disorders  The patient's weight, height, and BMI have been recorded in the chart.  I have made referrals, counseling, and provided education to the patient based on review of the above and I have provided the patient with a written personalized care plan for preventive services.     Jill Alexanders, MD   06/07/2020

## 2020-06-07 NOTE — Patient Instructions (Signed)
  [name] , Thank you for taking time to come for your Medicare Wellness Visit. I appreciate your ongoing commitment to your health goals. Please review the following plan we discussed and let me know if I can assist you in the future.   These are the goals we discussed: Call [phone] for bereavement counseling This is a list of the screening recommended for you and due dates:  Health Maintenance  Topic Date Due  . DEXA scan (bone density measurement)  Never done  . Complete foot exam   02/14/2020  . Flu Shot  04/07/2020  . Hemoglobin A1C  08/08/2020  . Eye exam for diabetics  11/26/2020  . Colon Cancer Screening  08/03/2021  . Mammogram  05/09/2022  . Tetanus Vaccine  06/29/2026  . COVID-19 Vaccine  Completed  .  Hepatitis C: One time screening is recommended by Center for Disease Control  (CDC) for  adults born from [birth year] through 1963.   Completed  . Pneumonia vaccines  Discontinued

## 2020-06-08 LAB — COMPREHENSIVE METABOLIC PANEL
ALT: 21 IU/L (ref 0–32)
AST: 15 IU/L (ref 0–40)
Albumin/Globulin Ratio: 1.5 (ref 1.2–2.2)
Albumin: 4.6 g/dL (ref 3.8–4.8)
Alkaline Phosphatase: 100 IU/L (ref 44–121)
BUN/Creatinine Ratio: 22 (ref 12–28)
BUN: 13 mg/dL (ref 8–27)
Bilirubin Total: 0.2 mg/dL (ref 0.0–1.2)
CO2: 28 mmol/L (ref 20–29)
Calcium: 10 mg/dL (ref 8.7–10.3)
Chloride: 99 mmol/L (ref 96–106)
Creatinine, Ser: 0.59 mg/dL (ref 0.57–1.00)
GFR calc Af Amer: 110 mL/min/{1.73_m2} (ref >59)
GFR calc non Af Amer: 96 mL/min/{1.73_m2} (ref >59)
Globulin, Total: 3.1 g/dL (ref 1.5–4.5)
Glucose: 109 mg/dL — ABNORMAL HIGH (ref 65–99)
Potassium: 4.5 mmol/L (ref 3.5–5.2)
Sodium: 137 mmol/L (ref 134–144)
Total Protein: 7.7 g/dL (ref 6.0–8.5)

## 2020-06-08 LAB — CBC WITH DIFFERENTIAL/PLATELET
Basophils Absolute: 0 10*3/uL (ref 0.0–0.2)
Basos: 1 %
EOS (ABSOLUTE): 0.3 10*3/uL (ref 0.0–0.4)
Eos: 7 %
Hematocrit: 36 % (ref 34.0–46.6)
Hemoglobin: 12.3 g/dL (ref 11.1–15.9)
Immature Grans (Abs): 0 10*3/uL (ref 0.0–0.1)
Immature Granulocytes: 0 %
Lymphocytes Absolute: 2 10*3/uL (ref 0.7–3.1)
Lymphs: 44 %
MCH: 29.4 pg (ref 26.6–33.0)
MCHC: 34.2 g/dL (ref 31.5–35.7)
MCV: 86 fL (ref 79–97)
Monocytes Absolute: 0.5 10*3/uL (ref 0.1–0.9)
Monocytes: 11 %
Neutrophils Absolute: 1.7 10*3/uL (ref 1.4–7.0)
Neutrophils: 37 %
Platelets: 329 10*3/uL (ref 150–450)
RBC: 4.19 x10E6/uL (ref 3.77–5.28)
RDW: 13.6 % (ref 11.7–15.4)
WBC: 4.6 10*3/uL (ref 3.4–10.8)

## 2020-06-08 LAB — LIPID PANEL
Chol/HDL Ratio: 3.4 ratio (ref 0.0–4.4)
Cholesterol, Total: 188 mg/dL (ref 100–199)
HDL: 55 mg/dL (ref >39)
LDL Chol Calc (NIH): 88 mg/dL (ref 0–99)
Triglycerides: 274 mg/dL — ABNORMAL HIGH (ref 0–149)
VLDL Cholesterol Cal: 45 mg/dL — ABNORMAL HIGH (ref 5–40)

## 2020-08-03 ENCOUNTER — Other Ambulatory Visit: Payer: Self-pay | Admitting: Family Medicine

## 2020-08-08 ENCOUNTER — Encounter: Payer: Self-pay | Admitting: Family Medicine

## 2020-08-08 ENCOUNTER — Ambulatory Visit (INDEPENDENT_AMBULATORY_CARE_PROVIDER_SITE_OTHER): Payer: PPO | Admitting: Family Medicine

## 2020-08-08 ENCOUNTER — Other Ambulatory Visit: Payer: Self-pay

## 2020-08-08 VITALS — BP 120/76 | HR 81 | Temp 96.6°F | Wt 191.8 lb

## 2020-08-08 DIAGNOSIS — Z23 Encounter for immunization: Secondary | ICD-10-CM

## 2020-08-08 DIAGNOSIS — M7521 Bicipital tendinitis, right shoulder: Secondary | ICD-10-CM | POA: Diagnosis not present

## 2020-08-08 NOTE — Progress Notes (Signed)
   Subjective:    Patient ID: [name], female    DOB: 06/28/1951, 69 y.o.   MRN: [id number]  HPI She is here for evaluation of a 1 day history of right shoulder pain.  She states it started on Tuesday.  No history of overuse.  No other joints are involved.  It is interfering with her ability to abduct or flex the arm.   Review of Systems     Objective:   Physical Exam Right shoulder exam shows tenderness palpation over the bicipital groove.  Pain on motion of the shoulder.  Negative sulcus sign.  Difficult to do supraspinatus testing.  Positive Speeds and Yergason test       Assessment & Plan:  Biceps tendinitis of right upper extremity  Immunization, viral disease - Plan: SARSCOV2 MODERNA BOOSTER VACCINE, 50 MCG/0.25 ML, IM, CANCELED: Moderna SARS-CoV-2 Vaccine Information given to patient concerning bicipital tendinitis in Spanish.  She is to use heat for 20 minutes 3 times per day as well as 2 Aleve twice per day for the next 10 days and then call me if continued difficulty.

## 2020-08-08 NOTE — Progress Notes (Deleted)
  Subjective:    Patient ID: [name], female    DOB: May 22, 1951, 69 y.o.   MRN: [id number]  [name] is a 69 y.o. female who presents for follow-up of Type 2 diabetes mellitus.  Home blood sugar records: {diabetes glucometry results:16657} Current symptoms/problems include {Symptoms; diabetes:14075} and have been {Desc; course:15616}. Daily foot checks:   Any foot concerns: *** Exercise: {types:19826} Diet: The following portions of the patient's history were reviewed and updated as appropriate: allergies, current medications, past medical history, past social history and problem list.  ROS as in subjective above.     Objective:    Physical Exam Alert and in no distress otherwise not examined.  Weight 191 lb 12.8 oz (87 kg).  Lab Review Diabetic Labs Latest Ref Rng & Units 06/07/2020 02/07/2020 10/10/2019 05/23/2019 02/14/2019  HbA1c 4.0 - 5.6 % 8.3(A) 8.4(A) 7.9(A) 7.3(A) 8.1(A)  Microalbumin mg/L 5.3 - - - <5.0  Micro/Creat Ratio - 8.3 - - - <12.4  Chol 100 - 199 mg/dL 188 - - 131 209(H)  HDL >39 mg/dL 55 - - 54 56  Calc LDL 0 - 99 mg/dL 88 - - 50 114(H)  Triglycerides 0 - 149 mg/dL 274(H) - - 164(H) 196(H)  Creatinine 0.57 - 1.00 mg/dL 0.59 - - - 0.62   BP/Weight 08/08/2020 06/07/2020 02/07/2020 10/10/2019 62/11/5595  Systolic BP - 416 384 536 468  Diastolic BP - 82 82 90 71  Wt. (Lbs) 191.8 188.2 186.4 183.2 187  BMI 30.96 30.38 30.09 29.57 30.18   Foot/eye exam completion dates Latest Ref Rng & Units 06/07/2020 02/14/2019  Eye Exam No Retinopathy - -  Foot Form Completion - Done Done    [name]  reports that she has never smoked. She has never used smokeless tobacco. She reports that she does not drink alcohol and does not use drugs.     Assessment & Plan:    No diagnosis found.  1. Rx changes: {none:33079} 2. Education: Reviewed 'ABCs' of diabetes management (respective goals in parentheses):  A1C (<7), blood pressure (<130/80), and cholesterol (LDL  <100). 3. Compliance at present is estimated to be {good/fair/poor:33178}. Efforts to improve compliance (if necessary) will be directed at {compliance:16716}. 4. Follow up: {NUMBERS; 0-10:33138} {time:11}

## 2020-08-08 NOTE — Addendum Note (Signed)
Addended by: Elyse Jarvis on: 08/08/2020 10:54 AM   Modules accepted: Orders

## 2020-08-08 NOTE — Patient Instructions (Addendum)
Tenosinovitis y tendinitis proximal del bceps Proximal Biceps Tendinitis and Tenosynovitis  El tendn proximal del bceps es un cordn resistente de tejido que conecta el msculo del bceps, en el frente de la parte superior del brazo, al omplato. La tendinitis es la inflamacin de un tendn. La tenosinovitis es la inflamacin de la membrana que rodea el tendn (vaina del tendn). Estas afecciones suelen ocurrir al [employer] tiempo y pueden interferir con la capacidad para flexionar el codo y Immunologist palma de la mano [place] ar[place]a. La tendinitis proximal del bceps y la tenosinovitis, por lo general, se deben al uso excesivo de la articulacin del hombro y el msculo del bceps. Estas afecciones suelen curarse en 6semanas. La tendinitis proximal del bceps puede tambin implicar un esguince de grado 1 o 2 del tendn.  Un esguince de grado 1 es leve e implica un ligero tirn del tendn, sin la presencia de una distensin o un desgarro significativo. Por lo general, no implica la prdida de fuerza en el msculo del bceps.  Un esguince de grado 2 es moderado e implica un pequeo desgarro del tendn. El tendn se estira y la fuerza del msculo del bceps disminuye. Cules son las causas? Esta afeccin puede ser causada por lo siguiente:  Un aumento sbito de la frecuencia o de la intensidad de una actividad que implica el uso del hombro y el msculo del bceps.  Uso excesivo del msculo del bceps. Esto puede suceder al hacer el mismo movimiento una y [place], por ejemplo: ? Girar la palma de la mano [place] arriba. ? Extensin forzosa (hiperextensin) del codo. ? Flexionar el codo.  Un golpe fuerte y Diplomatic Services operational officer en el codo o una lesin en el codo. Esto es poco frecuente. Qu incrementa el riesgo? Los siguientes factores pueden hacer que sea ms propenso a Armed forces training and education officer afeccin:  Careers information officer de contacto.  Practicar deportes que impliquen realizar lanzamientos o movimientos por encima del  hombro, como deportes de [place], gimnasia, levantamiento de pesas o fisicoculturismo.  Realizar tareas que impliquen actividad fsica.  Tener poca fuerza y flexibilidad en el brazo y [place]. Cules son los signos o los sntomas? Los sntomas de esta afeccin pueden incluir los siguientes:  Dolor e inflamacin en la parte de adelante del hombro.  Sensacin de calor en la parte de adelante del hombro.  Amplitud de movimientos limitada del hombro y el codo.  Un crujido (crepitacin) al mover o tocar el hombro o la parte superior del brazo. En algunos casos, los sntomas pueden volver a aparecer despus del tratamiento y pueden perdurar (crnicos). Cmo se diagnostica? Esta afeccin se diagnostica en funcin de lo siguiente:  Sus sntomas.  Sus antecedentes mdicos.  Examen fsico.  Radiografa o resonancia magntica (RM), si es necesario. Cmo se trata? El tratamiento de esta afeccin depende de la gravedad de la lesin. Puede incluir lo siguiente:  Dietitian.  Aplicar hielo en la zona de la lesin.  Realizar fisioterapia. El mdico tambin podr indicar lo siguiente:  Medicamentos para tratar Conservation officer, historic buildings y la inflamacin.  Ondas sonoras para tratar el msculo lesionado (terapia por ultrasonido).  Medicamentos que se Nutritional therapist (corticoesteroides).  Medicamentos para adormecer la zona (anestsicos locales).  [place]. Esta se realiza si los dems tratamientos no han funcionado. Siga estas instrucciones en su casa: Control del dolor, la rigidez y la hinchazn      Si se lo indican, aplique hielo sobre la zona de la lesin. ?  Ponga el hielo en una bolsa plstica. ? Coloque una [employer] piel y Therapist, nutritional. ? Aplique el hielo durante 76minutos, 2 a 3veces por da.  Si se lo indican, aplique calor en la zona afectada antes de realizar ejercicios. Use la fuente de calor que el mdico le recomiende, como una compresa de calor hmedo  o una almohadilla trmica. ? Coloque una [employer] piel y la fuente de Freight forwarder. ? Aplique calor durante 20 a 55minutos. ? Retire la fuente de calor si la piel se pone de color rojo brillante. Esto es especialmente importante si no puede sentir dolor, calor o fro. Puede correr un riesgo mayor de sufrir quemaduras.  Mueva los dedos con frecuencia para reducir la rigidez y la hinchazn.  Mantenga la zona de la lesin levantada (elevada) por encima del nivel del corazn mientras est acostado. Actividad  No levante ningn objeto que pese ms de 10libras (4.5kg) o que supere el lmite de peso que le hayan indicado, hasta que el mdico le diga que puede [place].  Evite las [employer] causen dolor o empeoren la afeccin.  Retome sus actividades normales segn lo indicado por el mdico. Pregntele al mdico qu actividades son seguras para usted.  Haga ejercicio como se lo haya indicado el mdico. Instrucciones generales  Use los medicamentos de venta libre y los recetados solamente como se lo haya indicado el mdico.  No consuma ningn producto que contenga nicotina o tabaco, como cigarrillos, cigarrillos electrnicos y tabaco de Higher education careers adviser. Estos pueden retrasar la recuperacin. Si necesita ayuda para dejar de fumar, consulte al mdico.  Concurra a todas las visitas de seguimiento como se lo haya indicado el mdico. Esto es importante. Cmo se evita?  Precaliente y elongue adecuadamente antes de hacer actividad fsica.  Reljese y elongue despus de hacer actividad fsica.  Dele al cuerpo tiempo para [employer] perodos de [place] fsica.  Asegrese de usar el equipo que sea apto para usted.  Tome medidas de seguridad y sea responsable al hacer [place], para evitar las cadas.  Mantenga un buen estado fsico, que incluye lo siguiente: ? [place]. ? Flexibilidad. ? Salud del corazn (buen [place] cardiovascular). ? La capacidad de usar msculos durante un  largo tiempo (resistencia). Comunquese con un mdico si:  Los sntomas empeoran o no mejoran despus de 2semanas de tratamiento.  Tiene nuevos sntomas. Solicite ayuda inmediatamente si:  Siente dolor intenso. Resumen  La tendinitis es la inflamacin del tendn del bceps. La tenosinovitis es la inflamacin de la membrana que rodea el tendn del bceps. Estas afecciones suelen ocurrir al [employer].  Por lo general, estas afecciones se deben al uso excesivo de la articulacin del hombro y el msculo del bceps.  Los sntomas incluyen dolor, calor en el hombro y amplitud de movimientos limitada.  [employer] afecciones se tratan con reposo, hielo, medicamentos y [place] (poco frecuente). Esta informacin no tiene Marine scientist el consejo del mdico. Asegrese de hacerle al mdico cualquier pregunta que tenga. Document Revised: 11/17/2018 Document Reviewed: 11/17/2018 Elsevier Patient Education  [place]. Take 2 Aleve twice per day for the next 10 days.  Use heat to your shoulder for about 20 minutes 3 times per day.  If you are still having trouble in another 10 days call me okay

## 2020-08-12 ENCOUNTER — Ambulatory Visit: Payer: PPO | Admitting: Family Medicine

## 2020-08-14 DIAGNOSIS — H26491 Other secondary cataract, right eye: Secondary | ICD-10-CM | POA: Diagnosis not present

## 2020-08-14 DIAGNOSIS — Z961 Presence of intraocular lens: Secondary | ICD-10-CM | POA: Diagnosis not present

## 2020-08-15 ENCOUNTER — Ambulatory Visit: Payer: PPO | Admitting: Family Medicine

## 2020-10-31 ENCOUNTER — Other Ambulatory Visit: Payer: PPO

## 2020-11-01 ENCOUNTER — Ambulatory Visit
Admission: RE | Admit: 2020-11-01 | Discharge: 2020-11-01 | Disposition: A | Discharge status: Home / Self Care | Payer: PPO | Source: Ambulatory Visit | Attending: Family Medicine | Admitting: Family Medicine

## 2020-11-01 ENCOUNTER — Other Ambulatory Visit: Payer: Self-pay

## 2020-11-01 DIAGNOSIS — Z780 Asymptomatic menopausal state: Secondary | ICD-10-CM | POA: Diagnosis not present

## 2020-11-01 DIAGNOSIS — M85852 Other specified disorders of bone density and structure, left thigh: Secondary | ICD-10-CM | POA: Diagnosis not present

## 2021-02-14 DIAGNOSIS — H401134 Primary open-angle glaucoma, bilateral, indeterminate stage: Secondary | ICD-10-CM | POA: Diagnosis not present

## 2021-02-14 LAB — HM DIABETES EYE EXAM

## 2021-02-24 ENCOUNTER — Other Ambulatory Visit: Payer: Self-pay | Admitting: Family Medicine

## 2021-03-13 ENCOUNTER — Ambulatory Visit (INDEPENDENT_AMBULATORY_CARE_PROVIDER_SITE_OTHER): Payer: PPO | Admitting: Family Medicine

## 2021-03-13 ENCOUNTER — Other Ambulatory Visit: Payer: Self-pay

## 2021-03-13 ENCOUNTER — Encounter: Payer: Self-pay | Admitting: Family Medicine

## 2021-03-13 VITALS — BP 128/82 | HR 70 | Temp 98.0°F | Ht 66.0 in | Wt 184.2 lb

## 2021-03-13 DIAGNOSIS — Z6830 Body mass index (BMI) 30.0-30.9, adult: Secondary | ICD-10-CM | POA: Diagnosis not present

## 2021-03-13 DIAGNOSIS — E669 Obesity, unspecified: Secondary | ICD-10-CM | POA: Diagnosis not present

## 2021-03-13 DIAGNOSIS — E1169 Type 2 diabetes mellitus with other specified complication: Secondary | ICD-10-CM

## 2021-03-13 DIAGNOSIS — R399 Unspecified symptoms and signs involving the genitourinary system: Secondary | ICD-10-CM | POA: Diagnosis not present

## 2021-03-13 LAB — POCT GLYCOSYLATED HEMOGLOBIN (HGB A1C): Hemoglobin A1C: 8.5 % — AB (ref 4.0–5.6)

## 2021-03-13 LAB — POCT URINALYSIS DIP (PROADVANTAGE DEVICE)
Bilirubin, UA: NEGATIVE
Blood, UA: NEGATIVE
Glucose, UA: NEGATIVE mg/dL
Ketones, POC UA: NEGATIVE mg/dL
Leukocytes, UA: NEGATIVE
Nitrite, UA: NEGATIVE
Protein Ur, POC: NEGATIVE mg/dL
Specific Gravity, Urine: 1.02
Urobilinogen, Ur: 0.2
pH, UA: 6 (ref 5.0–8.0)

## 2021-03-13 MED ORDER — PIOGLITAZONE HCL 30 MG PO TABS: 30.0000 mg | ORAL_TABLET | Freq: Every day | ORAL | 1 refills | Status: DC

## 2021-03-13 NOTE — Progress Notes (Signed)
  Subjective:    Patient ID: [name], female    DOB: 24-Nov-1950, 70 y.o.   MRN: [id number]  [name] is a 70 y.o. female who presents for follow-up of Type 2 diabetes mellitus.  Home blood sugar records: fasting range: fasting, meter records, lowest reading 98 and highest 177 Current symptoms/problems include none and have been stable. Daily foot checks: yes   Any foot concerns: none at this time Exercise: walking daily  Diet: fair She was tried on Invokana in the past but it caused yeast infections.  We also tried Trulicity however she is at all interested in giving herself shots.  She does continue on metformin.  She continues on lisinopril/HCTZ and Norvasc.  She does complain of some urinary urgency but no dysuria or frequency.  She also has a lesion on her right wrist. The following portions of the patient's history were reviewed and updated as appropriate: allergies, current medications, past medical history, past social history and problem list.  ROS as in subjective above.      Objective:    Physical Exam Alert and in no distress exam of the right wrist does show a round smooth movable nontender lesion on the radial surface of the wrist.  Urine dipstick was negative. Hemoglobin A1c is 8.5  Lab Review Diabetic Labs Latest Ref Rng & Units 06/07/2020 02/07/2020 10/10/2019 05/23/2019 02/14/2019  HbA1c 4.0 - 5.6 % 8.3(A) 8.4(A) 7.9(A) 7.3(A) 8.1(A)  Microalbumin mg/L 5.3 - - - <5.0  Micro/Creat Ratio - 8.3 - - - <12.4  Chol 100 - 199 mg/dL 188 - - 131 209(H)  HDL >39 mg/dL 55 - - 54 56  Calc LDL 0 - 99 mg/dL 88 - - 50 114(H)  Triglycerides 0 - 149 mg/dL 274(H) - - 164(H) 196(H)  Creatinine 0.57 - 1.00 mg/dL 0.59 - - - 0.62   BP/Weight 08/08/2020 06/07/2020 02/07/2020 10/10/2019 03/47/4259  Systolic BP 563 875 643 329 518  Diastolic BP 76 82 82 90 71  Wt. (Lbs) 191.8 188.2 186.4 183.2 187  BMI 30.96 30.38 30.09 29.57 30.18   Foot/eye exam completion dates Latest  Ref Rng & Units 06/07/2020 02/14/2019  Eye Exam No Retinopathy - -  Foot Form Completion - Done Done    Kyarra  reports that she has never smoked. She has never used smokeless tobacco. She reports that she does not drink alcohol and does not use drugs.     Assessment & Plan:    Type 2 diabetes mellitus with other specified complication, without long-term current use of insulin (HCC) - Plan: POCT glycosylated hemoglobin (Hb A1C), pioglitazone (ACTOS) 30 MG tablet  UTI symptoms - Plan: POCT Urinalysis DIP (Proadvantage Device)  Class 1 obesity with serious comorbidity and body mass index (BMI) of 30.0 to 30.9 in adult, unspecified obesity type  Rx changes:  Actos added to regimen Education: Reviewed 'ABCs' of diabetes management (respective goals in parentheses):  A1C (<7), blood pressure (<130/80), and cholesterol (LDL <100). Compliance at present is estimated to be poor. Efforts to improve compliance (if necessary) will be directed at increased exercise. Follow up: 4 months I again tried to convince her that it would be a good idea to get on Trulicity but she is still not interested.  Her A1c is unchanged so we will add Actos to the regimen and again strongly encouraged her to become more physically active.  Dietary changes so far have been unsuccessful.

## 2021-04-22 ENCOUNTER — Other Ambulatory Visit: Payer: Self-pay | Admitting: Family Medicine

## 2021-04-22 DIAGNOSIS — E1159 Type 2 diabetes mellitus with other circulatory complications: Secondary | ICD-10-CM

## 2021-04-22 DIAGNOSIS — I152 Hypertension secondary to endocrine disorders: Secondary | ICD-10-CM

## 2021-04-30 ENCOUNTER — Other Ambulatory Visit: Payer: Self-pay | Admitting: Family Medicine

## 2021-04-30 DIAGNOSIS — Z1231 Encounter for screening mammogram for malignant neoplasm of breast: Secondary | ICD-10-CM

## 2021-06-10 ENCOUNTER — Other Ambulatory Visit: Payer: Self-pay

## 2021-06-10 ENCOUNTER — Ambulatory Visit
Admission: RE | Admit: 2021-06-10 | Discharge: 2021-06-10 | Disposition: A | Discharge status: Home / Self Care | Payer: PPO | Source: Ambulatory Visit | Attending: Family Medicine | Admitting: Family Medicine

## 2021-06-10 DIAGNOSIS — Z1231 Encounter for screening mammogram for malignant neoplasm of breast: Secondary | ICD-10-CM | POA: Diagnosis not present

## 2021-06-20 ENCOUNTER — Other Ambulatory Visit: Payer: Self-pay

## 2021-06-20 ENCOUNTER — Other Ambulatory Visit (INDEPENDENT_AMBULATORY_CARE_PROVIDER_SITE_OTHER): Payer: PPO

## 2021-06-20 DIAGNOSIS — Z23 Encounter for immunization: Secondary | ICD-10-CM | POA: Diagnosis not present

## 2021-07-21 ENCOUNTER — Ambulatory Visit (INDEPENDENT_AMBULATORY_CARE_PROVIDER_SITE_OTHER): Payer: PPO | Admitting: Family Medicine

## 2021-07-21 ENCOUNTER — Other Ambulatory Visit: Payer: Self-pay

## 2021-07-21 VITALS — BP 130/82 | HR 77 | Temp 97.4°F | Wt 193.4 lb

## 2021-07-21 DIAGNOSIS — N898 Other specified noninflammatory disorders of vagina: Secondary | ICD-10-CM

## 2021-07-21 DIAGNOSIS — E785 Hyperlipidemia, unspecified: Secondary | ICD-10-CM

## 2021-07-21 DIAGNOSIS — E1159 Type 2 diabetes mellitus with other circulatory complications: Secondary | ICD-10-CM

## 2021-07-21 DIAGNOSIS — E1169 Type 2 diabetes mellitus with other specified complication: Secondary | ICD-10-CM | POA: Diagnosis not present

## 2021-07-21 DIAGNOSIS — I152 Hypertension secondary to endocrine disorders: Secondary | ICD-10-CM | POA: Diagnosis not present

## 2021-07-21 DIAGNOSIS — E6609 Other obesity due to excess calories: Secondary | ICD-10-CM | POA: Diagnosis not present

## 2021-07-21 DIAGNOSIS — R399 Unspecified symptoms and signs involving the genitourinary system: Secondary | ICD-10-CM

## 2021-07-21 DIAGNOSIS — Z6830 Body mass index (BMI) 30.0-30.9, adult: Secondary | ICD-10-CM | POA: Diagnosis not present

## 2021-07-21 DIAGNOSIS — K219 Gastro-esophageal reflux disease without esophagitis: Secondary | ICD-10-CM

## 2021-07-21 LAB — POCT WET PREP (WET MOUNT)
Clue Cells Wet Prep Whiff POC: NEGATIVE
Trichomonas Wet Prep HPF POC: ABSENT

## 2021-07-21 LAB — POCT GLYCOSYLATED HEMOGLOBIN (HGB A1C): Hemoglobin A1C: 6.9 % — AB (ref 4.0–5.6)

## 2021-07-21 LAB — POCT URINALYSIS DIP (PROADVANTAGE DEVICE)
Bilirubin, UA: NEGATIVE
Blood, UA: NEGATIVE
Glucose, UA: NEGATIVE mg/dL
Ketones, POC UA: NEGATIVE mg/dL
Leukocytes, UA: NEGATIVE
Nitrite, UA: NEGATIVE
Protein Ur, POC: NEGATIVE mg/dL
Specific Gravity, Urine: 1.025
Urobilinogen, Ur: 0.2
pH, UA: 5.5 (ref 5.0–8.0)

## 2021-07-21 LAB — POCT UA - MICROALBUMIN
Albumin/Creatinine Ratio, Urine, POC: 6.4
Creatinine, POC: 118.1 mg/dL
Microalbumin Ur, POC: 7.5 mg/L

## 2021-07-21 NOTE — Progress Notes (Signed)
Subjective:    Patient ID: [name], female    DOB: 1951-03-03, 70 y.o.   MRN: [id number]  [name] is a 70 y.o. female who presents for follow-up of Type 2 diabetes mellitus.  Home blood sugar records:  fasting and post meal Current symptoms/problems include none and have been improving. Daily foot checks:yes  Any foot concerns: no Exercise: The patient does not participate in regular exercise at present. Diet: Good She continues on amlodipine and lisinopril/HCTZ without difficulty.  She is also taking metformin and pioglitazone without trouble.  Simvastatin causes no aches or pains.  She does use Prilosec for her reflux and has this under really good control.  She does complain of some slight vaginal itching but no discharge.  She is planning a trip back to the [place] and spent several months there with relatives. The following portions of the patient's history were reviewed and updated as appropriate: allergies, current medications, past medical history, past social history and problem list.  ROS as in subjective above.     Objective:    Physical Exam Alert and in no distress vaginal exam shows normal mucosa.  KOH and wet prep is negative.  Blood pressure 130/82, pulse 77, temperature (!) 97.4 F (36.3 C), weight 193 lb 6.4 oz (87.7 kg), SpO2 97 %. Hemoglobin A1c is 6.9. Lab Review Diabetic Labs Latest Ref Rng & Units 07/21/2021 03/13/2021 06/07/2020 02/07/2020 10/10/2019  HbA1c 4.0 - 5.6 % 6.9(A) 8.5(A) 8.3(A) 8.4(A) 7.9(A)  Microalbumin mg/L 7.5 - 5.3 - -  Micro/Creat Ratio - 6.4 - 8.3 - -  Chol 100 - 199 mg/dL - - 188 - -  HDL >39 mg/dL - - 55 - -  Calc LDL 0 - 99 mg/dL - - 88 - -  Triglycerides 0 - 149 mg/dL - - 274(H) - -  Creatinine 0.57 - 1.00 mg/dL - - 0.59 - -   BP/Weight 07/21/2021 03/13/2021 08/08/2020 10/12/4268 02/06/3761  Systolic BP 831 517 616 073 710  Diastolic BP 82 82 76 82 82  Wt. (Lbs) 193.4 184.2 191.8 188.2 186.4  BMI 31.22  29.73 30.96 30.38 30.09   Foot/eye exam completion dates Latest Ref Rng & Units 07/21/2021 06/07/2020  Eye Exam No Retinopathy - -  Foot Form Completion - Done Done    [name]  reports that she has never smoked. She has never used smokeless tobacco. She reports that she does not drink alcohol and does not use drugs.     Assessment & Plan:    Type 2 diabetes mellitus with other specified complication, without long-term current use of insulin (HCC) - Plan: POCT UA - Microalbumin, POCT Urinalysis DIP (Proadvantage Device)  UTI symptoms - Plan: POCT glycosylated hemoglobin (Hb A1C)  Hypertension associated with diabetes (HCC)  Gastroesophageal reflux disease without esophagitis  Hyperlipidemia associated with type 2 diabetes mellitus (HCC)  Class 1 obesity due to excess calories with serious comorbidity and body mass index (BMI) of 30.0 to 30.9 in adult  Vagina itching - Plan: POCT Wet Prep [facility])  Rx changes: none Education: Reviewed 'ABCs' of diabetes management (respective goals in parentheses):  A1C (<7), blood pressure (<130/80), and cholesterol (LDL <100). Compliance at present is estimated to be good. Efforts to improve compliance (if necessary) will be directed at increased exercise.  She plans to become much more physically active when she is in the [place] Follow up: 6 months  Congratulated her on her improvement in her hemoglobin A1c.

## 2021-07-30 ENCOUNTER — Other Ambulatory Visit: Payer: Self-pay | Admitting: Family Medicine

## 2021-07-30 DIAGNOSIS — E1159 Type 2 diabetes mellitus with other circulatory complications: Secondary | ICD-10-CM

## 2021-08-05 ENCOUNTER — Other Ambulatory Visit: Payer: Self-pay | Admitting: Family Medicine

## 2021-08-05 DIAGNOSIS — E118 Type 2 diabetes mellitus with unspecified complications: Secondary | ICD-10-CM

## 2021-08-05 DIAGNOSIS — E1169 Type 2 diabetes mellitus with other specified complication: Secondary | ICD-10-CM

## 2021-08-05 DIAGNOSIS — E785 Hyperlipidemia, unspecified: Secondary | ICD-10-CM

## 2021-08-05 DIAGNOSIS — I152 Hypertension secondary to endocrine disorders: Secondary | ICD-10-CM

## 2021-10-09 ENCOUNTER — Encounter: Payer: Self-pay | Admitting: Internal Medicine

## 2021-11-09 ENCOUNTER — Other Ambulatory Visit: Payer: Self-pay | Admitting: Family Medicine

## 2021-11-09 DIAGNOSIS — E1159 Type 2 diabetes mellitus with other circulatory complications: Secondary | ICD-10-CM

## 2021-11-10 ENCOUNTER — Encounter: Payer: Self-pay | Admitting: Internal Medicine

## 2021-11-10 ENCOUNTER — Other Ambulatory Visit: Payer: Self-pay

## 2021-11-10 DIAGNOSIS — I152 Hypertension secondary to endocrine disorders: Secondary | ICD-10-CM

## 2021-11-10 MED ORDER — LISINOPRIL-HYDROCHLOROTHIAZIDE 20-12.5 MG PO TABS: 1.0000 | ORAL_TABLET | Freq: Every day | ORAL | 0 refills | Status: DC

## 2021-11-10 MED ORDER — AMLODIPINE BESYLATE 5 MG PO TABS: 5.0000 mg | ORAL_TABLET | Freq: Every day | ORAL | 0 refills | Status: DC

## 2021-11-14 ENCOUNTER — Telehealth: Payer: Self-pay | Admitting: Family Medicine

## 2021-11-14 NOTE — Telephone Encounter (Signed)
Left message for patient to call back and schedule Medicare Annual Wellness Visit (AWV) either virtually or in office. ?I left my number for patient to call [phone]. ? ?Last AWV 06/07/20 ? please schedule at anytime with health coach ? ?This should be a 45 minute visit.   ?

## 2021-11-19 ENCOUNTER — Ambulatory Visit: Payer: PPO | Admitting: Family Medicine

## 2021-11-24 ENCOUNTER — Ambulatory Visit (INDEPENDENT_AMBULATORY_CARE_PROVIDER_SITE_OTHER): Payer: PPO | Admitting: Family Medicine

## 2021-11-24 ENCOUNTER — Other Ambulatory Visit: Payer: Self-pay

## 2021-11-24 VITALS — BP 108/70 | HR 84 | Temp 97.7°F | Ht 66.0 in | Wt 192.6 lb

## 2021-11-24 DIAGNOSIS — E6609 Other obesity due to excess calories: Secondary | ICD-10-CM | POA: Diagnosis not present

## 2021-11-24 DIAGNOSIS — E785 Hyperlipidemia, unspecified: Secondary | ICD-10-CM

## 2021-11-24 DIAGNOSIS — I152 Hypertension secondary to endocrine disorders: Secondary | ICD-10-CM

## 2021-11-24 DIAGNOSIS — J301 Allergic rhinitis due to pollen: Secondary | ICD-10-CM

## 2021-11-24 DIAGNOSIS — Z86010 Personal history of colonic polyps: Secondary | ICD-10-CM | POA: Diagnosis not present

## 2021-11-24 DIAGNOSIS — Z6830 Body mass index (BMI) 30.0-30.9, adult: Secondary | ICD-10-CM | POA: Diagnosis not present

## 2021-11-24 DIAGNOSIS — E1159 Type 2 diabetes mellitus with other circulatory complications: Secondary | ICD-10-CM | POA: Diagnosis not present

## 2021-11-24 DIAGNOSIS — E1136 Type 2 diabetes mellitus with diabetic cataract: Secondary | ICD-10-CM

## 2021-11-24 DIAGNOSIS — K219 Gastro-esophageal reflux disease without esophagitis: Secondary | ICD-10-CM

## 2021-11-24 DIAGNOSIS — E1169 Type 2 diabetes mellitus with other specified complication: Secondary | ICD-10-CM | POA: Diagnosis not present

## 2021-11-24 LAB — POCT GLYCOSYLATED HEMOGLOBIN (HGB A1C): Hemoglobin A1C: 7.3 % — AB (ref 4.0–5.6)

## 2021-11-24 NOTE — Progress Notes (Signed)
?  Subjective:  ? ? Patient ID: [name], female    DOB: September 26, 1950, 71 y.o.   MRN: [id number] ? ?[name] is a 71 y.o. female who presents for follow-up of Type 2 diabetes mellitus. ? ?Home blood sugar records:  fasting 98-150 ?Current symptoms/problems include none and have been stable. ?Daily foot checks:yes  Any foot concerns: none  ?Exercise:  not so much ?Diet: fair ?She continues on amlodipine and lisinopril/HCTZ and having no difficulty with that.  Also taking metformin and has apparently stopped taking Actos.  Continues on simvastatin without difficulties.  She is not very physically active.  She is retired and enjoying her retirement.  Does get regular follow-up with gastroenterology and is scheduled for repeat colonoscopy for polyps in 2024. ?The following portions of the patient's history were reviewed and updated as appropriate: allergies, current medications, past medical history, past social history and problem list. ? ?ROS as in subjective above. ? ?   ?Objective:  ?  ?Physical Exam ?Alert and in no distress otherwise not examined. ? ?Blood pressure 108/70, pulse 84, temperature 97.7 ?F (36.5 ?C), height '5\' 6"'$  (1.676 m), weight 192 lb 9.6 oz (87.4 kg), SpO2 98 %. ?Hemoglobin A1c is 7.3 ?Lab Review ?Diabetic Labs Latest Ref Rng & Units 07/21/2021 03/13/2021 06/07/2020 02/07/2020 10/10/2019  ?HbA1c 4.0 - 5.6 % 6.9(A) 8.5(A) 8.3(A) 8.4(A) 7.9(A)  ?Microalbumin mg/L 7.5 - 5.3 - -  ?Micro/Creat Ratio - 6.4 - 8.3 - -  ?Chol 100 - 199 mg/dL - - 188 - -  ?HDL >39 mg/dL - - 55 - -  ?Calc LDL 0 - 99 mg/dL - - 88 - -  ?Triglycerides 0 - 149 mg/dL - - 274(H) - -  ?Creatinine 0.57 - 1.00 mg/dL - - 0.59 - -  ? ?BP/Weight 11/24/2021 07/21/2021 03/13/2021 08/08/2020 06/07/2020  ?Systolic BP 498 264 158 309 138  ?Diastolic BP 70 82 82 76 82  ?Wt. (Lbs) 192.6 193.4 184.2 191.8 188.2  ?BMI 31.09 31.22 29.73 30.96 30.38  ? ?Foot/eye exam completion dates Latest Ref Rng & Units 07/21/2021 06/07/2020  ?Eye  Exam No Retinopathy - -  ?Foot Form Completion - Done Done  ? ? ?[name]  reports that she has never smoked. She has never used smokeless tobacco. She reports that she does not drink alcohol and does not use drugs. ? ?   ?Assessment & Plan:  ?  ?Type 2 diabetes mellitus with other specified complication, without long-term current use of insulin ([place]) ? ?Hypertension associated with diabetes ([place]) ? ?Hyperlipidemia associated with type 2 diabetes mellitus ([place]) ? ?Class 1 obesity due to excess calories with serious comorbidity and body mass index (BMI) of 30.0 to 30.9 in adult ? ?Gastroesophageal reflux disease without esophagitis ? ?Cataract associated with type 2 diabetes mellitus ([place]) ? ?Seasonal allergic rhinitis due to pollen ? ?Hx of adenomatous colonic polyps ? ?Rx changes: none ?Education: Reviewed ?ABCs? of diabetes management (respective goals in parentheses):  A1C (<7), blood pressure (<130/80), and cholesterol (LDL <100). ?Compliance at present is estimated to be good. Efforts to improve compliance (if necessary) will be directed at increased exercise. ?Follow up: 4 months ? ? ? ? ?  ?

## 2021-11-25 ENCOUNTER — Other Ambulatory Visit: Payer: Self-pay | Admitting: Family Medicine

## 2021-11-25 DIAGNOSIS — E118 Type 2 diabetes mellitus with unspecified complications: Secondary | ICD-10-CM

## 2021-11-25 LAB — COMPREHENSIVE METABOLIC PANEL
ALT: 17 IU/L (ref 0–32)
AST: 13 IU/L (ref 0–40)
Albumin/Globulin Ratio: 1.4 (ref 1.2–2.2)
Albumin: 4 g/dL (ref 3.8–4.8)
Alkaline Phosphatase: 91 IU/L (ref 44–121)
BUN/Creatinine Ratio: 21 (ref 12–28)
BUN: 13 mg/dL (ref 8–27)
Bilirubin Total: 0.2 mg/dL (ref 0.0–1.2)
CO2: 24 mmol/L (ref 20–29)
Calcium: 9.5 mg/dL (ref 8.7–10.3)
Chloride: 99 mmol/L (ref 96–106)
Creatinine, Ser: 0.61 mg/dL (ref 0.57–1.00)
Globulin, Total: 2.8 g/dL (ref 1.5–4.5)
Glucose: 183 mg/dL — ABNORMAL HIGH (ref 70–99)
Potassium: 4.4 mmol/L (ref 3.5–5.2)
Sodium: 136 mmol/L (ref 134–144)
Total Protein: 6.8 g/dL (ref 6.0–8.5)
eGFR: 97 mL/min/{1.73_m2} (ref >59)

## 2021-11-25 LAB — CBC WITH DIFFERENTIAL/PLATELET
Basophils Absolute: 0.1 10*3/uL (ref 0.0–0.2)
Basos: 1 %
EOS (ABSOLUTE): 0.3 10*3/uL (ref 0.0–0.4)
Eos: 5 %
Hematocrit: 36.3 % (ref 34.0–46.6)
Hemoglobin: 11.9 g/dL (ref 11.1–15.9)
Immature Grans (Abs): 0 10*3/uL (ref 0.0–0.1)
Immature Granulocytes: 0 %
Lymphocytes Absolute: 2.4 10*3/uL (ref 0.7–3.1)
Lymphs: 38 %
MCH: 28.5 pg (ref 26.6–33.0)
MCHC: 32.8 g/dL (ref 31.5–35.7)
MCV: 87 fL (ref 79–97)
Monocytes Absolute: 0.5 10*3/uL (ref 0.1–0.9)
Monocytes: 7 %
Neutrophils Absolute: 3.1 10*3/uL (ref 1.4–7.0)
Neutrophils: 49 %
Platelets: 338 10*3/uL (ref 150–450)
RBC: 4.17 x10E6/uL (ref 3.77–5.28)
RDW: 14.2 % (ref 11.7–15.4)
WBC: 6.4 10*3/uL (ref 3.4–10.8)

## 2021-11-25 LAB — LIPID PANEL
Chol/HDL Ratio: 3.3 ratio (ref 0.0–4.4)
Cholesterol, Total: 180 mg/dL (ref 100–199)
HDL: 54 mg/dL (ref >39)
LDL Chol Calc (NIH): 94 mg/dL (ref 0–99)
Triglycerides: 190 mg/dL — ABNORMAL HIGH (ref 0–149)
VLDL Cholesterol Cal: 32 mg/dL (ref 5–40)

## 2021-11-25 MED ORDER — ATORVASTATIN CALCIUM 40 MG PO TABS: 40.0000 mg | ORAL_TABLET | Freq: Every day | ORAL | 3 refills | Status: DC

## 2021-11-25 NOTE — Addendum Note (Signed)
Addended by: Denita Lung on: 11/25/2021 11:35 AM ? ? Modules accepted: Orders ? ?

## 2021-12-15 ENCOUNTER — Ambulatory Visit (AMBULATORY_SURGERY_CENTER): Payer: Self-pay | Admitting: *Deleted

## 2021-12-15 ENCOUNTER — Other Ambulatory Visit: Payer: Self-pay

## 2021-12-15 VITALS — Ht 66.0 in | Wt 188.4 lb

## 2021-12-15 DIAGNOSIS — Z86010 Personal history of colonic polyps: Secondary | ICD-10-CM

## 2021-12-15 NOTE — Progress Notes (Signed)
No egg or soy allergy known to patient  ?No issues known to pt with past sedation with any surgeries or procedures ?Patient denies ever being told they had issues or difficulty with intubation  ?No FH of Malignant Hyperthermia ?Pt is not on diet pills ?Pt is not on  home 02  ?Pt is not on blood thinners  ?Pt denies issues with constipation  ?No A fib or A flutter ? ?Due to the COVID-19 pandemic we are asking patients to follow certain guidelines in PV and the [place]   ?Pt aware of COVID protocols and [facility] guidelines  ? ?PV completed over the phone. Pt verified name, DOB, address and insurance during PV today.  ?Pt mailed instruction packet with copy of consent form to read and not return, and instructions.  ?Pt encouraged to call with questions or issues.  ?If pt has My chart, procedure instructions sent via My Chart   ? ?Sample page of over the counter items to purchase given to pt. Interpreter present during pre-visit. Pt. Recently lost 61 year old son crying noted,she informed me that her son normally come with her to her appointments,allowed time to express self. ?

## 2021-12-28 ENCOUNTER — Encounter: Payer: Self-pay | Admitting: Certified Registered Nurse Anesthetist

## 2022-01-05 ENCOUNTER — Ambulatory Visit (AMBULATORY_SURGERY_CENTER): Payer: HMO | Admitting: Internal Medicine

## 2022-01-05 ENCOUNTER — Encounter: Payer: Self-pay | Admitting: Internal Medicine

## 2022-01-05 VITALS — BP 127/72 | HR 65 | Temp 97.7°F | Resp 18 | Ht 66.0 in | Wt 188.4 lb

## 2022-01-05 DIAGNOSIS — Z86010 Personal history of colonic polyps: Secondary | ICD-10-CM | POA: Diagnosis not present

## 2022-01-05 DIAGNOSIS — J45909 Unspecified asthma, uncomplicated: Secondary | ICD-10-CM | POA: Diagnosis not present

## 2022-01-05 DIAGNOSIS — D125 Benign neoplasm of sigmoid colon: Secondary | ICD-10-CM

## 2022-01-05 DIAGNOSIS — D122 Benign neoplasm of ascending colon: Secondary | ICD-10-CM | POA: Diagnosis not present

## 2022-01-05 DIAGNOSIS — I10 Essential (primary) hypertension: Secondary | ICD-10-CM | POA: Diagnosis not present

## 2022-01-05 DIAGNOSIS — E119 Type 2 diabetes mellitus without complications: Secondary | ICD-10-CM | POA: Diagnosis not present

## 2022-01-05 MED ORDER — SODIUM CHLORIDE 0.9 % IV SOLN: 500.0000 mL | Freq: Once | INTRAVENOUS | Status: DC

## 2022-01-05 NOTE — Progress Notes (Signed)
Report given to PACU, vss 

## 2022-01-05 NOTE — Op Note (Signed)
[place] ?Patient Name: [name] ?Procedure Date: 01/05/2022 8:40 AM ?MRN: [id number] ?Endoscopist: Gatha Mayer , MD ?Age: 71 ?Referring MD:  ?Date of Birth: May 08, 1951 ?Gender: Female ?Account #: [id number] ?Procedure:                Colonoscopy ?Indications:              Surveillance: Personal history of adenomatous  ?                          polyps on last colonoscopy > 5 years ago, Last  ?                          colonoscopy: November 2017 ?Medicines:                Monitored Anesthesia Care ?Procedure:                Pre-Anesthesia Assessment: ?                          - Prior to the procedure, a History and Physical  ?                          was performed, and patient medications and  ?                          allergies were reviewed. The patient's tolerance of  ?                          previous anesthesia was also reviewed. The risks  ?                          and benefits of the procedure and the sedation  ?                          options and risks were discussed with the patient.  ?                          All questions were answered, and informed consent  ?                          was obtained. Prior Anticoagulants: The patient has  ?                          taken no previous anticoagulant or antiplatelet  ?                          agents. ASA Grade Assessment: II - A patient with  ?                          mild systemic disease. After reviewing the risks  ?                          and benefits, the patient was deemed in  ?  satisfactory condition to undergo the procedure. ?                          After obtaining informed consent, the colonoscope  ?                          was passed under direct vision. Throughout the  ?                          procedure, the patient's blood pressure, pulse, and  ?                          oxygen saturations were monitored continuously. The  ?                          Olympus CF-HQ190L (Serial# [id number])  Colonoscope was  ?                          introduced through the anus and advanced to the the  ?                          cecum, identified by appendiceal orifice and  ?                          ileocecal valve. The colonoscopy was performed  ?                          without difficulty. The patient tolerated the  ?                          procedure well. The quality of the bowel  ?                          preparation was good. The ileocecal valve,  ?                          appendiceal orifice, and rectum were photographed.  ?                          The bowel preparation used was Miralax via split  ?                          dose instruction. ?Scope In: 9:02:53 AM ?Scope Out: 9:21:05 AM ?Total Procedure Duration: 0 hours 18 minutes 12 seconds  ?Findings:                 The perianal and digital rectal examinations were  ?                          normal. ?                          Two sessile polyps were found in the sigmoid colon  ?  and ascending colon. The polyps were diminutive in  ?                          size. These polyps were removed with a cold snare.  ?                          Resection and retrieval were complete. Verification  ?                          of patient identification for the specimen was  ?                          done. Estimated blood loss was minimal. ?                          Many small and large-mouthed diverticula were found  ?                          in the entire colon. ?                          The exam was otherwise without abnormality on  ?                          direct and retroflexion views. ?Complications:            No immediate complications. ?Estimated Blood Loss:     Estimated blood loss was minimal. ?Impression:               - Two diminutive polyps in the sigmoid colon and in  ?                          the ascending colon, removed with a cold snare.  ?                          Resected and retrieved. ?                          -  Diverticulosis in the entire examined colon. ?                          - The examination was otherwise normal on direct  ?                          and retroflexion views. ?                          - Personal history of colonic polyp 6 mm adenoma  ?                          07/2016. ?Recommendation:           - Patient has a contact number available for  ?                          emergencies. The signs and  symptoms of potential  ?                          delayed complications were discussed with the  ?                          patient. Return to normal activities tomorrow.  ?                          Written discharge instructions were provided to the  ?                          patient. ?                          - Resume previous diet. ?                          - Continue present medications. ?                          - Await pathology results. ?                          - Repeat colonoscopy is recommended for  ?                          surveillance. The colonoscopy date will be  ?                          determined after pathology results from today's  ?                          exam become available for review. ?                          - SCHEDULE DIRECT EGD DUE TO DYSPHAGIA ?Gatha Mayer, MD ?01/05/2022 9:36:01 AM ?This report has been signed electronically. ?

## 2022-01-05 NOTE — Progress Notes (Signed)
Interpreter used today at the [facility] for this pt.  Interpreter's name is- Lilly ? ?Pre-visit and EGD scheduled with patient.  ?Discharge instruction reviewed. All questions answered.  ?

## 2022-01-05 NOTE — Progress Notes (Signed)
Interpreter used today at the [facility] for this pt.  Interpreter's name is- Tim Lair  ? ?VS completed by [place]. ? ?Pt's states no medical or surgical changes since previsit or office visit. ? ?

## 2022-01-05 NOTE — Patient Instructions (Addendum)
I found and removed two tiny polyps that look benign. ? ?I will let you know pathology results and when to have another routine colonoscopy by mail and/or My Chart. ? ?You also have a condition called diverticulosis - common and not usually a problem. Please read the handout provided. ? ?We will schedule an upper endoscopy with esophageal dilation also - given your complaints of swallowing difficulty. ? ?I appreciate the opportunity to care for you. ?Gatha Mayer, MD, [facility] ? ? ? ?USTED Lillia Abed PROCEDIMIENTO ENDOSC?PICO HOY EN EL [facility]:   Lea el informe del procedimiento que se le entreg? para cualquier pregunta espec?fica sobre lo que se encontr? Education administrator.  Si el informe del examen no responde a sus preguntas, por favor llame a su gastroenter?logo para aclararlo.  Si usted solicit? que no se le den [employer] de lo que se Estate manager/land agent? en su procedimiento al acompa?ante que le va a cuidar, entonces el informe del procedimiento se ha incluido en un sobre sellado para que usted lo revise despu?s cuando le sea m?s conveniente. ?  ?LO QUE PUEDE ESPERAR: Algunas sensaciones de hinchaz?n en el abdomen.  Puede tener m?s gases de lo normal.  El caminar puede ayudarle a eliminar el aire que se le puso en el tracto gastrointestinal durante el procedimiento y reducir la hinchaz?n.  Si le hicieron una endoscopia inferior (como una colonoscopia o una sigmoidoscopia flexible), podr?a notar manchas de sangre en las heces fecales o en el papel higi?nico.  Si se someti? a una preparaci?n intestinal para su procedimiento, es posible que no tenga una evacuaci?n intestinal normal durante algunos d?as. ?  ?Tenga en cuenta:  Es posible que note un poco de irritaci?n y congesti?n en la nariz o alg?n drenaje.  Esto es debido al ox?geno utilizado durante su procedimiento.  No hay que preocuparse y esto debe desaparecer m?s o menos en un d?a. ?  ?S?NTOMAS PARA REPORTAR INMEDIATAMENTE: ? Despu?s de una endoscopia  inferior (colonoscopia o sigmoidoscopia flexible): ? Cantidades excesivas de sangre en las heces fecales ? Sensibilidad significativa o empeoramiento de los dolores abdominales  ? Hinchaz?n aguda del abdomen que antes no ten?a  ? Fiebre de 100?F o m?s ?  ?Para asuntos urgentes o de emergencia, puede comunicarse con un gastroenter?logo a cualquier hora llamando al [phone]. ? ?DIETA:  Recomendamos una comida peque?a al principio, pero luego puede continuar con su dieta normal.  Tome muchos l?quidos, Teacher, adult education las bebidas alcoh?licas durante 24 horas.  ?  ?ACTIVIDAD:  Debe planear tomarse las cosas con calma por el resto del d?a y no debe CONDUCIR ni usar maquinaria pesada hasta ma?ana (debido a los medicamentos de sedaci?n Furniture conservator/restorer).   ?  ?SEGUIMIENTO: ?Teacher, music? al n?mero que aparece en su historial al siguiente d?a h?bil de su procedimiento para ver c?mo se siente y para responder cualquier pregunta o inquietud que pueda tener con respecto a la informaci?n que se le dio despu?s del procedimiento. Si no podemos contactarle, le dejaremos un mensaje.  Sin embargo, si se siente bien y no tiene ning?n problema, no es necesario que nos devuelva la llamada.  Asumiremos que ha regresado a sus actividades diarias normales sin incidentes. ?Si se le tomaron algunas biopsias, le contactaremos por tel?fono o por carta en las pr?ximas 3 semanas.  Si no ha sabido [employer] biopsias en el transcurso de 3 semanas, por favor ll?menos al [phone].  ? ?FIRMAS/CONFIDENCIALIDAD: ?Usted  y/o el acompa?ante que le cuide han firmado documentos que se ingresar?n en su historial m?dico electr?nico.  Estas firmas atestiguan el hecho de que la informaci?n anterior   ?

## 2022-01-05 NOTE — Progress Notes (Signed)
[facility] History and Physical ? ? ?Primary Care Physician:  Denita Lung, MD ? ? ?Reason for Procedure:   Hx colon adenoma ? ?Plan:    Colonoscopy ?Will schedule EGD later date (dysphagia) ? ? ? ? ?HPI: [name] is a 71 y.o. female here for surveillance colonoscopy because of hx colon polyp - 6 mm adenoma removed 07/2016 Also c/o dysphagia - has had in past and helped by [facility] dilation (last in 2020-) ? ? ?Past Medical History:  ?Diagnosis Date  ? Allergic rhinitis   ? Allergy   ? pollen   ? Asthma   ? with pollen  ? Cataract   ? Diabetes mellitus   ? Diverticulosis of colon   ? GERD (gastroesophageal reflux disease)   ? Glaucoma   ? Helicobacter pylori gastritis   ? HLD (hyperlipidemia)   ? Hx of adenomatous polyp of colon 08/06/2016  ? Hypertension   ? Obesity   ? ? ?Past Surgical History:  ?Procedure Laterality Date  ? CATARACT EXTRACTION Bilateral   ? COLONOSCOPY  04/08/2006; 07/2016  ? POLYPECTOMY    ? UPPER GASTROINTESTINAL ENDOSCOPY  06/16/2011  ? H. pylori gastritis with polyp and hyperakeratosis of esophagus  ? VAGINAL HYSTERECTOMY  09/27/2006  ? and left paraovarian cystectomy  ? ? ?Prior to Admission medications   ?Medication Sig Start Date End Date Taking? Authorizing Provider  ?amLODipine (NORVASC) 5 MG tablet Take 1 tablet (5 mg total) by mouth daily. 11/10/21  Yes Denita Lung, MD  ?atorvastatin (LIPITOR) 40 MG tablet Take 1 tablet (40 mg total) by mouth daily. 11/25/21  Yes Denita Lung, MD  ?Blood Glucose Monitoring Suppl [facility] BLOOD GLUCOSE SYSTEM) DEVI Test twice daily DX: E11.9 07/14/16  Yes Denita Lung, MD  ?Cholecalciferol (VITAMIN D3 PO) Take 1,000 mg by mouth daily.   Yes [provider]  ?dorzolamide-timolol (COSOPT) 22.3-6.8 MG/ML ophthalmic solution 1 drop 2 (two) times daily. 06/25/19  Yes [provider]  ?lisinopril-hydrochlorothiazide (ZESTORETIC) 20-12.5 MG tablet Take 1 tablet by mouth daily. 11/10/21  Yes Denita Lung, MD   ?metFORMIN (GLUCOPHAGE) 1000 MG tablet Take 1 tablet by mouth twice daily 11/26/21  Yes Denita Lung, MD  ?Multiple Vitamins-Minerals (MULTIVITAMIN WITH MINERALS) tablet Take 1 tablet by mouth daily.     Yes [provider]  ?Jonetta Speak Lancets 56E MISC Use as directed twice daily 12/01/18  Yes Denita Lung, MD  ?[facility] VERIO test strip USE AS DIRECTED TWICE DAILY 08/06/21  Yes Denita Lung, MD  ?albuterol (PROVENTIL HFA;VENTOLIN HFA) 108 (90 Base) MCG/ACT inhaler Inhale 2 puffs into the lungs every 6 (six) hours as needed for wheezing or shortness of breath. ?Patient not taking: Reported on 03/07/2018 01/03/18   Denita Lung, MD  ?chlorhexidine (PERIDEX) 0.12 % solution 15 mLs 2 (two) times daily. ?Patient not taking: Reported on 11/24/2021 02/03/20   [provider]  ?hyoscyamine (LEVSIN SL) 0.125 MG SL tablet PLACE 1 TABLET UNDER THE TONGUE EVERY 4 HOURS AS NEEDED ?Patient not taking: No sig reported 10/24/19   Levin Erp, PA  ? ? ?Current Outpatient Medications  ?Medication Sig Dispense Refill  ? amLODipine (NORVASC) 5 MG tablet Take 1 tablet (5 mg total) by mouth daily. 90 tablet 0  ? atorvastatin (LIPITOR) 40 MG tablet Take 1 tablet (40 mg total) by mouth daily. 90 tablet 3  ? Blood Glucose Monitoring Suppl (CONTOUR BLOOD GLUCOSE SYSTEM) DEVI Test twice daily DX: E11.9 1  Device 0  ? Cholecalciferol (VITAMIN D3 PO) Take 1,000 mg by mouth daily.    ? dorzolamide-timolol (COSOPT) 22.3-6.8 MG/ML ophthalmic solution 1 drop 2 (two) times daily.    ? lisinopril-hydrochlorothiazide (ZESTORETIC) 20-12.5 MG tablet Take 1 tablet by mouth daily. 90 tablet 0  ? metFORMIN (GLUCOPHAGE) 1000 MG tablet Take 1 tablet by mouth twice daily 180 tablet 0  ? Multiple Vitamins-Minerals (MULTIVITAMIN WITH MINERALS) tablet Take 1 tablet by mouth daily.      ? OneTouch Delica Lancets 24O MISC Use as directed twice daily 100 each 1  ? ONETOUCH VERIO test strip USE AS DIRECTED TWICE DAILY 100 each  0  ? albuterol (PROVENTIL HFA;VENTOLIN HFA) 108 (90 Base) MCG/ACT inhaler Inhale 2 puffs into the lungs every 6 (six) hours as needed for wheezing or shortness of breath. (Patient not taking: Reported on 03/07/2018) 1 Inhaler 0  ? chlorhexidine (PERIDEX) 0.12 % solution 15 mLs 2 (two) times daily. (Patient not taking: Reported on 11/24/2021)    ? hyoscyamine (LEVSIN SL) 0.125 MG SL tablet PLACE 1 TABLET UNDER THE TONGUE EVERY 4 HOURS AS NEEDED (Patient not taking: No sig reported) 30 tablet 2  ? ?Current Facility-Administered Medications  ?Medication Dose Route Frequency Provider Last Rate Last Admin  ? 0.9 %  sodium chloride infusion  500 mL Intravenous Once Gatha Mayer, MD      ? ? ?Allergies as of 01/05/2022 - Review Complete 01/05/2022  ?Allergen Reaction Noted  ? Brimonidine Other (See Comments) 01/19/2014  ? ? ?Family History  ?Problem Relation Age of Onset  ? Diabetes Mother   ? Heart disease Mother   ? Colon cancer Paternal Aunt   ? Colon cancer Paternal Uncle   ? Colon polyps Cousin   ? Rectal cancer Neg Hx   ? Stomach cancer Neg Hx   ? Esophageal cancer Neg Hx   ? Crohn's disease Neg Hx   ? ? ?Social History  ? ?Socioeconomic History  ? Marital status: Single  ?  Spouse name: Not on file  ? Number of children: 2  ? Years of education: Not on file  ? Highest education level: Not on file  ?Occupational History  ? Occupation: Radiation protection practitioner  ?Tobacco Use  ? Smoking status: Never  ?  Passive exposure: Never  ? Smokeless tobacco: Never  ?Vaping Use  ? Vaping Use: Never used  ?Substance and Sexual Activity  ? Alcohol use: No  ? Drug use: No  ? Sexual activity: Not Currently  ?Other Topics Concern  ? Not on file  ?Social History Narrative  ? 2 caffeine drinks daily   ? ?Social Determinants of Health  ? ?Financial Resource Strain: Not on file  ?Food Insecurity: Not on file  ?Transportation Needs: Not on file  ?Physical Activity: Not on file  ?Stress: Not on file  ?Social Connections: Not on file   ?Intimate Partner Violence: Not on file  ? ? ?Review of Systems: ?\ ?All other review of systems negative except as mentioned in the HPI. ? ?Physical Exam: ?Vital signs ?BP 124/82   Pulse 79   Temp 97.7 ?F (36.5 ?C) (Temporal)   Ht '5\' 6"'$  (1.676 m)   Wt 188 lb 6.4 oz (85.5 kg)   SpO2 96%   BMI 30.41 kg/m?  ? ?General:   Alert,  Well-developed, well-nourished, pleasant and cooperative in NAD ?Lungs:  Clear throughout to auscultation.   ?Heart:  Regular rate and rhythm; no murmurs, clicks, rubs,  or gallops. ?Abdomen:  Soft, nontender and nondistended. Normal bowel sounds.   ?Neuro/Psych:  Alert and cooperative. Normal mood and affect. A and O x 3 ? ? ?'@Britney Newstrom'$  Simonne Maffucci, MD, [facility] ?[facility] ?[phone] (pager) ?01/05/2022 8:50 AM@ ? ?

## 2022-01-06 ENCOUNTER — Telehealth: Payer: Self-pay

## 2022-01-07 ENCOUNTER — Telehealth: Payer: Self-pay

## 2022-01-07 NOTE — Telephone Encounter (Signed)
Left message on answering machine. 

## 2022-01-11 ENCOUNTER — Encounter: Payer: Self-pay | Admitting: Internal Medicine

## 2022-01-15 ENCOUNTER — Ambulatory Visit (AMBULATORY_SURGERY_CENTER): Payer: Self-pay | Admitting: *Deleted

## 2022-01-15 VITALS — Ht 66.0 in | Wt 186.4 lb

## 2022-01-15 DIAGNOSIS — R1310 Dysphagia, unspecified: Secondary | ICD-10-CM

## 2022-01-15 NOTE — Progress Notes (Signed)
No egg or soy allergy known to patient  ?No issues known to pt with past sedation with any surgeries or procedures ?Patient denies ever being told they had issues or difficulty with intubation  ?No FH of Malignant Hyperthermia ?Pt is not on diet pills ?Pt is not on  home 02  ?Pt is not on blood thinners  ? ?No A fib or A flutter ? ? ?PV completed in person with an interpreter present. Pt verified name, DOB, address and insurance during PV today.  ?Pt given instruction packet and signed consent after reading. Denied any questions. ?Pt encouraged to call with questions or issues.  ?If pt has My chart, procedure instructions sent via My Chart  ?Insurance confirmed with pt at [facility] today   ?

## 2022-01-23 ENCOUNTER — Encounter: Payer: Self-pay | Admitting: Certified Registered Nurse Anesthetist

## 2022-01-29 ENCOUNTER — Ambulatory Visit (AMBULATORY_SURGERY_CENTER): Payer: HMO | Admitting: Internal Medicine

## 2022-01-29 ENCOUNTER — Encounter: Payer: Self-pay | Admitting: Internal Medicine

## 2022-01-29 VITALS — BP 126/71 | HR 72 | Temp 98.6°F | Resp 16 | Ht 66.0 in | Wt 186.4 lb

## 2022-01-29 DIAGNOSIS — R1310 Dysphagia, unspecified: Secondary | ICD-10-CM

## 2022-01-29 DIAGNOSIS — I10 Essential (primary) hypertension: Secondary | ICD-10-CM | POA: Diagnosis not present

## 2022-01-29 DIAGNOSIS — E119 Type 2 diabetes mellitus without complications: Secondary | ICD-10-CM | POA: Diagnosis not present

## 2022-01-29 MED ORDER — SODIUM CHLORIDE 0.9 % IV SOLN: 500.0000 mL | Freq: Once | INTRAVENOUS | Status: DC

## 2022-01-29 NOTE — Op Note (Signed)
[place] Patient Name: [name] Procedure Date: 01/29/2022 2:40 PM MRN: [id number] Endoscopist: Gatha Mayer , MD Age: 71 Referring MD:  Date of Birth: 11/18/1950 Gender: Female Account #: [id number] Procedure:                Upper GI endoscopy Indications:              Dysphagia Medicines:                Monitored Anesthesia Care Procedure:                Pre-Anesthesia Assessment:                           - Prior to the procedure, a History and Physical                            was performed, and patient medications and                            allergies were reviewed. The patient's tolerance of                            previous anesthesia was also reviewed. The risks                            and benefits of the procedure and the sedation                            options and risks were discussed with the patient.                            All questions were answered, and informed consent                            was obtained. Prior Anticoagulants: The patient has                            taken no previous anticoagulant or antiplatelet                            agents. ASA Grade Assessment: II - A patient with                            mild systemic disease. After reviewing the risks                            and benefits, the patient was deemed in                            satisfactory condition to undergo the procedure.                           After obtaining informed consent, the endoscope was  passed under direct vision. Throughout the                            procedure, the patient's blood pressure, pulse, and                            oxygen saturations were monitored continuously. The                            Endoscope was introduced through the mouth, and                            advanced to the second part of duodenum. The upper                            GI endoscopy was accomplished without  difficulty.                            The patient tolerated the procedure well. Scope In: Scope Out: Findings:                 No endoscopic abnormality was evident in the                            esophagus to explain the patient's complaint of                            dysphagia. It was decided, however, to proceed with                            dilation of the entire esophagus. The scope was                            withdrawn. Dilation was performed with a Maloney                            dilator with mild resistance at 28 Fr. The dilation                            site was examined following endoscope reinsertion                            and showed no change. Estimated blood loss was                            minimal.                           The exam was otherwise without abnormality.                           The cardia and gastric fundus were normal on  retroflexion. Complications:            No immediate complications. Estimated Blood Loss:     Estimated blood loss was minimal. Slight heme from                            pharynx and dilator tip trauma Impression:               - No endoscopic esophageal abnormality to explain                            patient's dysphagia. Esophagus dilated. Dilated.                           - The examination was otherwise normal.                           - No specimens collected. Recommendation:           - Patient has a contact number available for                            emergencies. The signs and symptoms of potential                            delayed complications were discussed with the                            patient. Return to normal activities tomorrow.                            Written discharge instructions were provided to the                            patient.                           - Clear liquids x 1 hour then soft foods rest of                            day. Start prior diet  tomorrow.                           - Continue present medications.                           - return to clinic if dilation not helpful Gatha Mayer, MD 01/29/2022 3:06:39 PM This report has been signed electronically.

## 2022-01-29 NOTE — Progress Notes (Signed)
1455 Robinul 0.1 mg IV given due large amount of secretions upon assessment.  MD made aware, vss  

## 2022-01-29 NOTE — Progress Notes (Signed)
Report given to PACU, vss 

## 2022-01-29 NOTE — Progress Notes (Signed)
[facility] History and Physical   Primary Care Physician:  Denita Lung, MD   Reason for Procedure:   dysphagia  Plan:    EGD, dilation     HPI: [name] is a 71 y.o. female recently seen for colonoscopy - she was c/o dysphagia and has hx successful treatment w/ Maloney dilation in past.   Past Medical History:  Diagnosis Date   Allergic rhinitis    Allergy    pollen    Asthma    with pollen   Cataract    Diabetes mellitus    Diverticulosis of colon    GERD (gastroesophageal reflux disease)    Glaucoma    Helicobacter pylori gastritis    HLD (hyperlipidemia)    Hx of adenomatous polyp of colon 08/06/2016   Hypertension    Obesity     Past Surgical History:  Procedure Laterality Date   CATARACT EXTRACTION Bilateral    COLONOSCOPY  04/08/2006; 07/2016   POLYPECTOMY     UPPER GASTROINTESTINAL ENDOSCOPY  06/16/2011   H. pylori gastritis with polyp and hyperakeratosis of esophagus   VAGINAL HYSTERECTOMY  09/27/2006   and left paraovarian cystectomy    Prior to Admission medications   Medication Sig Start Date End Date Taking? Authorizing Provider  albuterol (PROVENTIL HFA;VENTOLIN HFA) 108 (90 Base) MCG/ACT inhaler Inhale 2 puffs into the lungs every 6 (six) hours as needed for wheezing or shortness of breath. Patient not taking: Reported on 03/07/2018 01/03/18   Denita Lung, MD  amLODipine (NORVASC) 5 MG tablet Take 1 tablet (5 mg total) by mouth daily. 11/10/21   Denita Lung, MD  atorvastatin (LIPITOR) 40 MG tablet Take 1 tablet (40 mg total) by mouth daily. 11/25/21   Denita Lung, MD  Blood Glucose Monitoring Suppl [facility] BLOOD GLUCOSE SYSTEM) DEVI Test twice daily DX: E11.9 07/14/16   Denita Lung, MD  chlorhexidine (PERIDEX) 0.12 % solution 15 mLs 2 (two) times daily. 02/03/20   [provider]  Cholecalciferol (VITAMIN D3 PO) Take 1,000 mg by mouth daily.    [provider]  dorzolamide-timolol (COSOPT)  22.3-6.8 MG/ML ophthalmic solution 1 drop 2 (two) times daily. 06/25/19   [provider]  hyoscyamine (LEVSIN SL) 0.125 MG SL tablet PLACE 1 TABLET UNDER THE TONGUE EVERY 4 HOURS AS NEEDED Patient not taking: No sig reported 10/24/19   Levin Erp, PA  lisinopril-hydrochlorothiazide (ZESTORETIC) 20-12.5 MG tablet Take 1 tablet by mouth daily. 11/10/21   Denita Lung, MD  metFORMIN (GLUCOPHAGE) 1000 MG tablet Take 1 tablet by mouth twice daily 11/26/21   Denita Lung, MD  Multiple Vitamins-Minerals (MULTIVITAMIN WITH MINERALS) tablet Take 1 tablet by mouth daily.      [provider]  OneTouch Delica Lancets 56L MISC Use as directed twice daily 12/01/18   Denita Lung, MD  [facility] VERIO test strip USE AS DIRECTED TWICE DAILY 08/06/21   Denita Lung, MD    Current Outpatient Medications  Medication Sig Dispense Refill   albuterol (PROVENTIL HFA;VENTOLIN HFA) 108 (90 Base) MCG/ACT inhaler Inhale 2 puffs into the lungs every 6 (six) hours as needed for wheezing or shortness of breath. (Patient not taking: Reported on 03/07/2018) 1 Inhaler 0   amLODipine (NORVASC) 5 MG tablet Take 1 tablet (5 mg total) by mouth daily. 90 tablet 0   atorvastatin (LIPITOR) 40 MG tablet Take 1 tablet (40 mg total) by mouth daily. 90 tablet 3   Blood Glucose  Monitoring Suppl (CONTOUR BLOOD GLUCOSE SYSTEM) DEVI Test twice daily DX: E11.9 1 Device 0   chlorhexidine (PERIDEX) 0.12 % solution 15 mLs 2 (two) times daily.     Cholecalciferol (VITAMIN D3 PO) Take 1,000 mg by mouth daily.     dorzolamide-timolol (COSOPT) 22.3-6.8 MG/ML ophthalmic solution 1 drop 2 (two) times daily.     hyoscyamine (LEVSIN SL) 0.125 MG SL tablet PLACE 1 TABLET UNDER THE TONGUE EVERY 4 HOURS AS NEEDED (Patient not taking: No sig reported) 30 tablet 2   lisinopril-hydrochlorothiazide (ZESTORETIC) 20-12.5 MG tablet Take 1 tablet by mouth daily. 90 tablet 0   metFORMIN (GLUCOPHAGE) 1000 MG tablet Take 1 tablet by  mouth twice daily 180 tablet 0   Multiple Vitamins-Minerals (MULTIVITAMIN WITH MINERALS) tablet Take 1 tablet by mouth daily.       OneTouch Delica Lancets 76P MISC Use as directed twice daily 100 each 1   ONETOUCH VERIO test strip USE AS DIRECTED TWICE DAILY 100 each 0   Current Facility-Administered Medications  Medication Dose Route Frequency Provider Last Rate Last Admin   0.9 %  sodium chloride infusion  500 mL Intravenous Once Gatha Mayer, MD        Allergies as of 01/29/2022 - Review Complete 01/29/2022  Allergen Reaction Noted   Brimonidine Other (See Comments) 01/19/2014    Family History  Problem Relation Age of Onset   Diabetes Mother    Heart disease Mother    Colon cancer Paternal Aunt    Colon cancer Paternal Uncle    Colon polyps Cousin    Rectal cancer Neg Hx    Stomach cancer Neg Hx    Esophageal cancer Neg Hx    Crohn's disease Neg Hx     Social History   Socioeconomic History   Marital status: Single    Spouse name: Not on file   Number of children: 2   Years of education: Not on file   Highest education level: Not on file  Occupational History   Occupation: Radiation protection practitioner  Tobacco Use   Smoking status: Never    Passive exposure: Never   Smokeless tobacco: Never  Vaping Use   Vaping Use: Never used  Substance and Sexual Activity   Alcohol use: No   Drug use: No   Sexual activity: Not Currently  Other Topics Concern   Not on file  Social History Narrative   2 caffeine drinks daily    Social Determinants of Health   Financial Resource Strain: Not on file  Food Insecurity: Not on file  Transportation Needs: Not on file  Physical Activity: Not on file  Stress: Not on file  Social Connections: Not on file  Intimate Partner Violence: Not on file    Review of Systems: All other review of systems negative except as mentioned in the HPI.  Physical Exam: Vital signs BP 135/74   Pulse 70   Temp 98.6 F (37 C) (Temporal)    Ht '5\' 6"'$  (1.676 m)   Wt 186 lb 6.4 oz (84.6 kg)   SpO2 99%   BMI 30.09 kg/m   General:   Alert,  Well-developed, well-nourished, pleasant and cooperative in NAD Lungs:  Clear throughout to auscultation.   Heart:  Regular rate and rhythm; no murmurs, clicks, rubs,  or gallops. Abdomen:  Soft, nontender and nondistended. Normal bowel sounds.   Neuro/Psych:  Alert and cooperative. Normal mood and affect. A and O x 3   '@Jaidev Sanger'$  Simonne Maffucci, MD, [facility] [phone] (pager) 01/29/2022 2:26 PM@

## 2022-01-29 NOTE — Progress Notes (Signed)
VS completed by CW.   Interpreter used today at the [employer] [facility] for this pt.  Interpreter's name is- Tim Lair   Pt's states no medical or surgical changes since previsit or office visit.

## 2022-01-29 NOTE — Patient Instructions (Addendum)
I dilated the esophagus like I did in 2020.  Clear liquids x 1 hour then soft foods rest of day. Start prior diet tomorrow. Handout provided   I hope that helps again. If not - please come to see me in the office.  I appreciate the opportunity to care for you. Gatha Mayer, MD, FACG  YOU HAD AN ENDOSCOPIC PROCEDURE TODAY AT [facility]:   Refer to the procedure report that was given to you for any specific questions about what was found during the examination.  If the procedure report does not answer your questions, please call your gastroenterologist to clarify.  If you requested that your care partner not be given the details of your procedure findings, then the procedure report has been included in a sealed envelope for you to review at your convenience later.  YOU SHOULD EXPECT: Some feelings of bloating in the abdomen. Passage of more gas than usual.  Walking can help get rid of the air that was put into your GI tract during the procedure and reduce the bloating. If you had a lower endoscopy (such as a colonoscopy or flexible sigmoidoscopy) you may notice spotting of blood in your stool or on the toilet paper. If you underwent a bowel prep for your procedure, you may not have a normal bowel movement for a few days.  Please Note:  You might notice some irritation and congestion in your nose or some drainage.  This is from the oxygen used during your procedure.  There is no need for concern and it should clear up in a day or so.  SYMPTOMS TO REPORT IMMEDIATELY:  Following upper endoscopy (EGD)  Vomiting of blood or coffee ground material  New chest pain or pain under the shoulder blades  Painful or persistently difficult swallowing  New shortness of breath  Fever of 100F or higher  Black, tarry-looking stools  For urgent or emergent issues, a gastroenterologist can be reached at any hour by calling [phone]. Do not use MyChart messaging for urgent concerns.     DIET:  Clear liquids x 1 hour then soft foods rest of day. Start prior diet tomorrow..  Drink plenty of fluids but you should avoid alcoholic beverages for 24 hours.  ACTIVITY:  You should plan to take it easy for the rest of today and you should NOT DRIVE or use heavy machinery until tomorrow (because of the sedation medicines used during the test).    FOLLOW UP: Our staff will call the number listed on your records 48-72 hours following your procedure to check on you and address any questions or concerns that you may have regarding the information given to you following your procedure. If we do not reach you, we will leave a message.  We will attempt to reach you two times.  During this call, we will ask if you have developed any symptoms of COVID 19. If you develop any symptoms (ie: fever, flu-like symptoms, shortness of breath, cough etc.) before then, please call [phone].  If you test positive for Covid 19 in the 2 weeks post procedure, please call and report this information to [place].    If any biopsies were taken you will be contacted by phone or by letter within the next 1-3 weeks.  Please call us at [phone] if you have not heard about the biopsies in 3 weeks.    SIGNATURES/CONFIDENTIALITY: You and/or your care partner have signed paperwork which will be entered into  your electronic medical record.  These signatures attest to the fact that that the information above on your After Visit Summary has been reviewed and is understood.  Full responsibility of the confidentiality of this discharge information lies with you and/or your care-partner.  USTED TUVO UN PROCEDIMIENTO ENDOSCPICO HOY EN EL [facility]:   Lea el informe del procedimiento que se le entreg para cualquier pregunta especfica sobre lo que se Primary school teacher.  Si el informe del examen no responde a sus preguntas, por favor llame a su gastroenterlogo para aclararlo.  Si usted solicit que no  se le den [employer] de lo que se Estate manager/land agent en su procedimiento al [employer] va a cuidar, entonces el informe del procedimiento se ha incluido en un sobre sellado para que usted lo revise despus cuando le sea ms conveniente.   LO QUE PUEDE ESPERAR: Algunas sensaciones de hinchazn en el abdomen.  Puede tener ms gases de lo normal.  El caminar puede ayudarle a eliminar el aire que se le puso en el tracto gastrointestinal durante el procedimiento y reducir la hinchazn.  Si le hicieron una endoscopia inferior (como una colonoscopia o una sigmoidoscopia flexible), podra notar manchas de sangre en las heces fecales o en el papel higinico.  Si se someti a una preparacin intestinal para su procedimiento, es posible que no tenga una evacuacin intestinal normal durante [employer].   Tenga en cuenta:  Es posible que note un poco de irritacin y congestin en la nariz o algn drenaje.  Esto es debido al oxgeno [employer] durante su procedimiento.  No hay que preocuparse y esto debe desaparecer ms o Scientist, research (medical).   SNTOMAS PARA REPORTAR INMEDIATAMENTE:  Despus de una endoscopia inferior (colonoscopia o sigmoidoscopia flexible):  Cantidades excesivas de sangre en las heces fecales  Sensibilidad significativa o empeoramiento de los dolores abdominales   Hinchazn aguda del abdomen que antes no tena   Fiebre de 100F o ms   Despus de la endoscopia superior (EGD)  Vmitos de Biochemist, clinical o material como caf molido   Dolor en el pecho o dolor debajo de los omplatos que antes no tena   Dolor o dificultad persistente para tragar  Falta de aire que antes no tena   Fiebre de 100F o ms  Heces fecales negras y pegajosas   Para asuntos urgentes o de Freight forwarder, puede comunicarse con un gastroenterlogo a cualquier hora llamando al [phone].  DIETA:  Recomendamos una comida pequea al principio, pero luego puede continuar con su dieta normal.  Tome muchos lquidos, Teacher, adult education las  bebidas alcohlicas durante 24 horas.    ACTIVIDAD:  Debe planear tomarse las cosas con calma por el resto del da y no debe CONDUCIR ni usar maquinaria pesada Programmer, applications (debido a los medicamentos de sedacin utilizados durante el examen).     SEGUIMIENTO: Nuestro personal llamar al nmero que aparece en su historial al siguiente da hbil de su procedimiento para ver cmo se siente y para responder cualquier pregunta o inquietud que pueda tener con respecto a la informacin que se le dio despus del procedimiento. Si no podemos contactarle, le dejaremos un mensaje.  Sin embargo, si se siente bien y no tiene Paediatric nurse, no es necesario que nos devuelva la llamada.  Asumiremos que ha regresado a sus actividades diarias normales sin incidentes. Si se le tomaron algunas biopsias, le contactaremos por telfono o por carta en las prximas 3 semanas.  Si no  ha sabido [employer] biopsias en el transcurso de 3 semanas, por favor llmenos al [phone].   FIRMAS/CONFIDENCIALIDAD: Usted y/o el acompaante que le cuide han firmado documentos que se ingresarn en su historial mdico Emergency planning/management officer.  Estas firmas atestiguan el hecho de que la informacin anterior USTED TUVO UN PROCEDIMIENTO ENDOSCPICO HOY EN EL [facility]:   Lea el informe del procedimiento que se le entreg para cualquier pregunta especfica sobre lo que se Primary school teacher.  Si el informe del examen no responde a sus preguntas, por favor llame a su gastroenterlogo para aclararlo.  Si usted solicit que no se le den [employer] de lo que se Estate manager/land agent en su procedimiento al [employer] va a cuidar, entonces el informe del procedimiento se ha incluido en un sobre sellado para que usted lo revise despus cuando le sea ms conveniente.   LO QUE PUEDE ESPERAR: Algunas sensaciones de hinchazn en el abdomen.  Puede tener ms gases de lo normal.  El caminar puede ayudarle a eliminar el aire que se le puso en el  tracto gastrointestinal durante el procedimiento y reducir la hinchazn.  Si le hicieron una endoscopia inferior (como una colonoscopia o una sigmoidoscopia flexible), podra notar manchas de sangre en las heces fecales o en el papel higinico.  Si se someti a una preparacin intestinal para su procedimiento, es posible que no tenga una evacuacin intestinal normal durante [employer].   Tenga en cuenta:  Es posible que note un poco de irritacin y congestin en la nariz o algn drenaje.  Esto es debido al oxgeno [employer] durante su procedimiento.  No hay que preocuparse y esto debe desaparecer ms o Scientist, research (medical).   SNTOMAS PARA REPORTAR INMEDIATAMENTE:  Despus de una endoscopia inferior (colonoscopia o sigmoidoscopia flexible):  Cantidades excesivas de sangre en las heces fecales  Sensibilidad significativa o empeoramiento de los dolores abdominales   Hinchazn aguda del abdomen que antes no tena   Fiebre de 100F o ms   Despus de la endoscopia superior (EGD)  Vmitos de Biochemist, clinical o material como caf molido   Dolor en el pecho o dolor debajo de los omplatos que antes no tena   Dolor o dificultad persistente para tragar  Falta de aire que antes no tena   Fiebre de 100F o ms  Heces fecales negras y pegajosas   Para asuntos urgentes o de Freight forwarder, puede comunicarse con un gastroenterlogo a cualquier hora llamando al [phone].  DIETA:   Tome muchos lquidos, Teacher, adult education las bebidas alcohlicas durante 24 horas.    ACTIVIDAD:  Debe planear tomarse las cosas con calma por el resto del da y no debe CONDUCIR ni usar maquinaria pesada Programmer, applications (debido a los medicamentos de sedacin utilizados durante el examen).     SEGUIMIENTO: Nuestro personal llamar al nmero que aparece en su historial al siguiente da hbil de su procedimiento para ver cmo se siente y para responder cualquier pregunta o inquietud que pueda tener con respecto a la informacin que se le dio  despus del procedimiento. Si no podemos contactarle, le dejaremos un mensaje.  Sin embargo, si se siente bien y no tiene Paediatric nurse, no es necesario que nos devuelva la llamada.  Asumiremos que ha regresado a sus actividades diarias normales sin incidentes. Si se le tomaron algunas biopsias, le contactaremos por telfono o por carta en las prximas 3 semanas.  Si no ha sabido [employer]  transcurso de 3 semanas, por favor llmenos al [phone].   FIRMAS/CONFIDENCIALIDAD: Usted y/o el acompaante que le cuide han firmado documentos que se ingresarn en su historial mdico electrnico.  Estas firmas atestiguan el hecho de que la informacin anterior

## 2022-01-29 NOTE — Progress Notes (Signed)
Called to room to assist during endoscopic procedure.  Patient ID and intended procedure confirmed with present staff. Received instructions for my participation in the procedure from the performing physician.  

## 2022-01-30 ENCOUNTER — Telehealth: Payer: Self-pay

## 2022-01-30 NOTE — Telephone Encounter (Signed)
  Follow up Call-     01/29/2022    2:22 PM 01/05/2022    8:39 AM 07/31/2019    1:57 PM  Call back number  Post procedure Call Back phone  # [phone]  Permission to leave phone message Yes Yes Yes     Patient questions:  Do you have a fever, pain , or abdominal swelling? No. Pain Score  0 *  Have you tolerated food without any problems? Yes.    Have you been able to return to your normal activities? Yes.    Do you have any questions about your discharge instructions: Diet   No. Medications  No. Follow up visit  No.  Do you have questions or concerns about your Care? No.  Actions: * If pain score is 4 or above: No action needed, pain <4.

## 2022-01-30 NOTE — Telephone Encounter (Signed)
Duplicate Follow up call entry in error

## 2022-02-05 ENCOUNTER — Other Ambulatory Visit: Payer: Self-pay | Admitting: Family Medicine

## 2022-02-05 DIAGNOSIS — I152 Hypertension secondary to endocrine disorders: Secondary | ICD-10-CM

## 2022-02-07 ENCOUNTER — Other Ambulatory Visit: Payer: Self-pay | Admitting: Family Medicine

## 2022-02-19 ENCOUNTER — Other Ambulatory Visit: Payer: Self-pay | Admitting: Family Medicine

## 2022-02-19 DIAGNOSIS — E118 Type 2 diabetes mellitus with unspecified complications: Secondary | ICD-10-CM

## 2022-02-27 ENCOUNTER — Ambulatory Visit (INDEPENDENT_AMBULATORY_CARE_PROVIDER_SITE_OTHER): Payer: PPO

## 2022-02-27 VITALS — BP 150/80 | HR 89 | Temp 98.0°F | Ht 64.5 in | Wt 188.4 lb

## 2022-02-27 DIAGNOSIS — Z0000 Encounter for general adult medical examination without abnormal findings: Secondary | ICD-10-CM

## 2022-03-13 ENCOUNTER — Encounter: Payer: Self-pay | Admitting: Family Medicine

## 2022-03-13 ENCOUNTER — Ambulatory Visit (INDEPENDENT_AMBULATORY_CARE_PROVIDER_SITE_OTHER): Payer: PPO | Admitting: Family Medicine

## 2022-03-13 VITALS — BP 110/70 | HR 72 | Temp 97.2°F | Ht 64.0 in | Wt 186.6 lb

## 2022-03-13 DIAGNOSIS — E1169 Type 2 diabetes mellitus with other specified complication: Secondary | ICD-10-CM

## 2022-03-13 DIAGNOSIS — E785 Hyperlipidemia, unspecified: Secondary | ICD-10-CM

## 2022-03-13 DIAGNOSIS — Z86010 Personal history of colonic polyps: Secondary | ICD-10-CM

## 2022-03-13 DIAGNOSIS — I152 Hypertension secondary to endocrine disorders: Secondary | ICD-10-CM | POA: Diagnosis not present

## 2022-03-13 DIAGNOSIS — K219 Gastro-esophageal reflux disease without esophagitis: Secondary | ICD-10-CM

## 2022-03-13 DIAGNOSIS — E11319 Type 2 diabetes mellitus with unspecified diabetic retinopathy without macular edema: Secondary | ICD-10-CM

## 2022-03-13 DIAGNOSIS — N3281 Overactive bladder: Secondary | ICD-10-CM | POA: Diagnosis not present

## 2022-03-13 DIAGNOSIS — J301 Allergic rhinitis due to pollen: Secondary | ICD-10-CM | POA: Diagnosis not present

## 2022-03-13 DIAGNOSIS — H409 Unspecified glaucoma: Secondary | ICD-10-CM | POA: Diagnosis not present

## 2022-03-13 DIAGNOSIS — Z9849 Cataract extraction status, unspecified eye: Secondary | ICD-10-CM | POA: Diagnosis not present

## 2022-03-13 DIAGNOSIS — Z0000 Encounter for general adult medical examination without abnormal findings: Secondary | ICD-10-CM | POA: Diagnosis not present

## 2022-03-13 DIAGNOSIS — E1159 Type 2 diabetes mellitus with other circulatory complications: Secondary | ICD-10-CM

## 2022-03-13 LAB — POCT GLYCOSYLATED HEMOGLOBIN (HGB A1C): Hemoglobin A1C: 8.3 % — AB (ref 4.0–5.6)

## 2022-03-13 MED ORDER — DAPAGLIFLOZIN PROPANEDIOL 5 MG PO TABS: 5.0000 mg | ORAL_TABLET | Freq: Every day | ORAL | 5 refills | Status: DC

## 2022-03-13 NOTE — Progress Notes (Signed)
Complete physical exam  Patient: [name]   DOB: June 12, 1951   71 y.o. Female  MRN: [id number]  Subjective:    She is here with her interpreter, Orene Abbasi is a 71 y.o. female who presents today for a complete physical exam. She reports consuming a diabetic, 2500-3000 calorie diet. Walking at least 20 minutes a day but not regularly.  She generally feels well. She reports sleeping well. She does have questions concerning the endoscopy and colonoscopy.  She continues on metformin twice per day.  She is also taking amlodipine and lisinopril/HCTZ.  Continues on Lipitor without difficulty.  She does have a history of colonic polyps.  She also has reflux and did have an EGD.  She did have a dilatation done which apparently has helped with her reflux type symptoms.  She has had cataract surgery and has glaucoma and is on medication for that.  She has a previous history of OAB but has not had any difficulty with that recently.  She does not smoke.   Most recent fall risk assessment:    02/27/2022    1:35 PM  [place] in the past year? 0  Number falls in past yr: 0  Injury with Fall? 0  Risk for fall due to : Medication side effect  Follow up Falls evaluation completed;Education provided;Falls prevention discussed     Most recent depression screenings:    03/13/2022    8:36 AM 02/27/2022    1:35 PM  PHQ 2/9 Scores  PHQ - 2 Score 0 0  PHQ- 9 Score  2      Patient Active Problem List   Diagnosis Date Noted   Eosinophils increased 12/01/2016   Hx of adenomatous colonic polyps 08/06/2016   Hyperlipidemia associated with type 2 diabetes mellitus ([place]) 08/01/2014   Glaucoma (increased eye pressure) 08/01/2014   OAB (overactive bladder) 01/01/2014   History of positive PPD, treatment status unknown 01/01/2014   Tension headache 07/17/2011   Hypertension associated with diabetes ([place]) 02/25/2011   Class 1 obesity with serious comorbidity and  body mass index (BMI) of 30.0 to 30.9 in adult 02/25/2011   Diabetes mellitus ([place]) 02/25/2011   Gastroesophageal reflux disease without esophagitis 02/25/2011   Cataract associated with type 2 diabetes mellitus ([place]) 02/25/2011   Diverticulosis 02/25/2011   Allergic rhinitis 02/25/2011   Past Medical History:  Diagnosis Date   Allergic rhinitis    Allergy    pollen    Asthma    with pollen   Cataract    Diabetes mellitus    Diverticulosis of colon    GERD (gastroesophageal reflux disease)    Glaucoma    Helicobacter pylori gastritis    HLD (hyperlipidemia)    Hx of adenomatous polyp of colon 08/06/2016   Hypertension    Obesity    Past Surgical History:  Procedure Laterality Date   CATARACT EXTRACTION Bilateral    COLONOSCOPY  04/08/2006; 07/2016   POLYPECTOMY     UPPER GASTROINTESTINAL ENDOSCOPY  06/16/2011   H. pylori gastritis with polyp and hyperakeratosis of esophagus   VAGINAL HYSTERECTOMY  09/27/2006   and left paraovarian cystectomy   Social History   Tobacco Use   Smoking status: Never    Passive exposure: Never   Smokeless tobacco: Never  Vaping Use   Vaping Use: Never used  Substance Use Topics   Alcohol use: No   Drug use: No   Family History  Problem  Relation Age of Onset   Diabetes Mother    Heart disease Mother    Colon cancer Paternal Aunt    Colon cancer Paternal Uncle    Colon polyps Cousin    Rectal cancer Neg Hx    Stomach cancer Neg Hx    Esophageal cancer Neg Hx    Crohn's disease Neg Hx    Allergies  Allergen Reactions   Brimonidine Other (See Comments)    redness      Patient Care Team: Denita Lung, MD as PCP - General (Family Medicine)   Outpatient Medications Prior to Visit  Medication Sig   amLODipine (NORVASC) 5 MG tablet Take 1 tablet by mouth once daily   atorvastatin (LIPITOR) 40 MG tablet Take 1 tablet (40 mg total) by mouth daily.   Blood Glucose Monitoring Suppl (CONTOUR BLOOD GLUCOSE SYSTEM) DEVI Test twice  daily DX: E11.9   dorzolamide-timolol (COSOPT) 22.3-6.8 MG/ML ophthalmic solution 1 drop 2 (two) times daily.   lisinopril-hydrochlorothiazide (ZESTORETIC) 20-12.5 MG tablet Take 1 tablet by mouth once daily   metFORMIN (GLUCOPHAGE) 1000 MG tablet Take 1 tablet by mouth twice daily   Multiple Vitamins-Minerals (MULTIVITAMIN WITH MINERALS) tablet Take 1 tablet by mouth daily.     OneTouch Delica Lancets 16X MISC Use as directed twice daily   ONETOUCH VERIO test strip USE AS DIRECTED TWICE DAILY   albuterol (PROVENTIL HFA;VENTOLIN HFA) 108 (90 Base) MCG/ACT inhaler Inhale 2 puffs into the lungs every 6 (six) hours as needed for wheezing or shortness of breath. (Patient not taking: Reported on 03/07/2018)   chlorhexidine (PERIDEX) 0.12 % solution 15 mLs 2 (two) times daily. (Patient not taking: Reported on 02/27/2022)   Cholecalciferol (VITAMIN D3 PO) Take 1,000 mg by mouth daily. (Patient not taking: Reported on 01/29/2022)   hyoscyamine (LEVSIN SL) 0.125 MG SL tablet PLACE 1 TABLET UNDER THE TONGUE EVERY 4 HOURS AS NEEDED (Patient not taking: No sig reported)   No facility-administered medications prior to visit.    Review of Systems  All other systems reviewed and are negative.         Objective:     BP Readings from Last 3 Encounters:  03/13/22 110/70  02/27/22 (!) 150/80  01/29/22 126/71   Wt Readings from Last 3 Encounters:  03/13/22 186 lb 9.6 oz (84.6 kg)  02/27/22 188 lb 6.4 oz (85.5 kg)  01/29/22 186 lb 6.4 oz (84.6 kg)      Physical Exam  Alert and in no distress. Tympanic membranes and canals are normal. Pharyngeal area is normal. Neck is supple without adenopathy or thyromegaly. Cardiac exam shows a regular sinus rhythm without murmurs or gallops. Lungs are clear to auscultation. Hemoglobin A1c is 8.3 Last CBC Lab Results  Component Value Date   WBC 6.4 11/24/2021   HGB 11.9 11/24/2021   HCT 36.3 11/24/2021   MCV 87 11/24/2021   MCH 28.5 11/24/2021   RDW 14.2  11/24/2021   PLT 338 09/60/4540   Last metabolic panel Lab Results  Component Value Date   GLUCOSE 183 (H) 11/24/2021   NA 136 11/24/2021   K 4.4 11/24/2021   CL 99 11/24/2021   CO2 24 11/24/2021   BUN 13 11/24/2021   CREATININE 0.61 11/24/2021   EGFR 97 11/24/2021   CALCIUM 9.5 11/24/2021   PROT 6.8 11/24/2021   ALBUMIN 4.0 11/24/2021   LABGLOB 2.8 11/24/2021   AGRATIO 1.4 11/24/2021   BILITOT 0.2 11/24/2021   ALKPHOS 91 11/24/2021   AST  13 11/24/2021   ALT 17 11/24/2021   ANIONGAP 11 03/21/2018   Last lipids Lab Results  Component Value Date   CHOL 180 11/24/2021   HDL 54 11/24/2021   LDLCALC 94 11/24/2021   TRIG 190 (H) 11/24/2021   CHOLHDL 3.3 11/24/2021   Last hemoglobin A1c Lab Results  Component Value Date   HGBA1C 8.3 (A) 03/13/2022   Last vitamin D No results found for: "25OHVITD2", "25OHVITD3", "VD25OH"      Assessment & Plan:    Routine general medical examination at a health care facility - Plan: CBC with Differential/Platelet, Comprehensive metabolic panel, POCT glycosylated hemoglobin (Hb A1C), Lipid panel  Hypertension associated with diabetes ([place]) - Plan: CBC with Differential/Platelet, Comprehensive metabolic panel  History of cataract extraction, unspecified laterality  Glaucoma of both eyes, unspecified glaucoma type  Hx of adenomatous colonic polyps  OAB (overactive bladder)  Seasonal allergic rhinitis due to pollen  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, without long-term current use of insulin, unspecified retinopathy severity ([place]) - Plan: CBC with Differential/Platelet, Comprehensive metabolic panel, dapagliflozin propanediol (FARXIGA) 5 MG TABS tablet  Gastroesophageal reflux disease without esophagitis  Hyperlipidemia associated with type 2 diabetes mellitus ([place]) - Plan: Lipid panel  Immunization History  Administered Date(s) Administered   DTaP 02/10/2006   Fluad Quad(high  Dose 65+) 05/23/2019, 06/07/2020, 06/20/2021   Influenza Split 08/03/2011   Influenza, High Dose Seasonal PF 10/11/2018   Influenza,inj,Quad PF,6+ Mos 07/04/2013, 06/05/2014, 06/29/2016, 07/15/2017   Moderna SARS-COV2 Booster Vaccination 08/08/2020   Moderna Sars-Covid-2 Vaccination 11/15/2019, 12/13/2019   Pneumococcal Conjugate-13 08/12/2015   Pneumococcal Polysaccharide-23 07/04/2013   Tdap 06/29/2016   Zoster Recombinat (Shingrix) 08/03/2020, 01/19/2021   Zoster, Live 08/01/2014    Health Maintenance  Topic Date Due   OPHTHALMOLOGY EXAM  02/14/2022   INFLUENZA VACCINE  04/07/2022   FOOT EXAM  07/21/2022   HEMOGLOBIN A1C  09/13/2022   MAMMOGRAM  06/11/2023   TETANUS/TDAP  06/29/2026   COLONOSCOPY (Pts 45-16yr Insurance coverage will need to be confirmed)  01/05/2029   DEXA SCAN  Completed   Hepatitis C Screening  Completed   Zoster Vaccines- Shingrix  Completed   HPV VACCINES  Aged Out   Pneumonia Vaccine 71 Years old  Discontinued   COVID-19 Vaccine  Discontinued  I discussed the endoscopy with her as well as a colonoscopy explaining the fact that she has adenomatous polyp.  She is scheduled for repeat in 7 years.  Then discussed diet and exercise with her in fact that we need to get more aggressive.  I will place her on Farxiga.  Discussed possible side effects of the medication with her.  She expressed understanding of this with the help of the interpreter. Also cautioned on the use of Farxiga and the cost and if there is an issue she is to call [place] Rec[place] here in 4 months..Jill Alexanders MD

## 2022-03-13 NOTE — Patient Instructions (Signed)

## 2022-03-14 LAB — CBC WITH DIFFERENTIAL/PLATELET
Basophils Absolute: 0.1 10*3/uL (ref 0.0–0.2)
Basos: 1 %
EOS (ABSOLUTE): 0.5 10*3/uL — ABNORMAL HIGH (ref 0.0–0.4)
Eos: 8 %
Hematocrit: 36.3 % (ref 34.0–46.6)
Hemoglobin: 11.8 g/dL (ref 11.1–15.9)
Immature Grans (Abs): 0 10*3/uL (ref 0.0–0.1)
Immature Granulocytes: 0 %
Lymphocytes Absolute: 2.2 10*3/uL (ref 0.7–3.1)
Lymphs: 36 %
MCH: 28.7 pg (ref 26.6–33.0)
MCHC: 32.5 g/dL (ref 31.5–35.7)
MCV: 88 fL (ref 79–97)
Monocytes Absolute: 0.5 10*3/uL (ref 0.1–0.9)
Monocytes: 8 %
Neutrophils Absolute: 2.9 10*3/uL (ref 1.4–7.0)
Neutrophils: 47 %
Platelets: 302 10*3/uL (ref 150–450)
RBC: 4.11 x10E6/uL (ref 3.77–5.28)
RDW: 13.8 % (ref 11.7–15.4)
WBC: 6.2 10*3/uL (ref 3.4–10.8)

## 2022-03-14 LAB — COMPREHENSIVE METABOLIC PANEL
ALT: 20 IU/L (ref 0–32)
AST: 17 IU/L (ref 0–40)
Albumin/Globulin Ratio: 1.4 (ref 1.2–2.2)
Albumin: 4.2 g/dL (ref 3.8–4.8)
Alkaline Phosphatase: 103 IU/L (ref 44–121)
BUN/Creatinine Ratio: 14 (ref 12–28)
BUN: 8 mg/dL (ref 8–27)
Bilirubin Total: 0.5 mg/dL (ref 0.0–1.2)
CO2: 25 mmol/L (ref 20–29)
Calcium: 9.5 mg/dL (ref 8.7–10.3)
Chloride: 104 mmol/L (ref 96–106)
Creatinine, Ser: 0.57 mg/dL (ref 0.57–1.00)
Globulin, Total: 2.9 g/dL (ref 1.5–4.5)
Glucose: 140 mg/dL — ABNORMAL HIGH (ref 70–99)
Potassium: 4.6 mmol/L (ref 3.5–5.2)
Sodium: 140 mmol/L (ref 134–144)
Total Protein: 7.1 g/dL (ref 6.0–8.5)
eGFR: 99 mL/min/{1.73_m2} (ref >59)

## 2022-03-14 LAB — LIPID PANEL
Chol/HDL Ratio: 1.8 ratio (ref 0.0–4.4)
Cholesterol, Total: 99 mg/dL — ABNORMAL LOW (ref 100–199)
HDL: 56 mg/dL (ref >39)
LDL Chol Calc (NIH): 27 mg/dL (ref 0–99)
Triglycerides: 80 mg/dL (ref 0–149)
VLDL Cholesterol Cal: 16 mg/dL (ref 5–40)

## 2022-04-02 DIAGNOSIS — E119 Type 2 diabetes mellitus without complications: Secondary | ICD-10-CM | POA: Diagnosis not present

## 2022-04-02 DIAGNOSIS — H401131 Primary open-angle glaucoma, bilateral, mild stage: Secondary | ICD-10-CM | POA: Diagnosis not present

## 2022-04-02 DIAGNOSIS — H401134 Primary open-angle glaucoma, bilateral, indeterminate stage: Secondary | ICD-10-CM | POA: Diagnosis not present

## 2022-04-29 ENCOUNTER — Other Ambulatory Visit: Payer: Self-pay | Admitting: Family Medicine

## 2022-04-29 DIAGNOSIS — E1159 Type 2 diabetes mellitus with other circulatory complications: Secondary | ICD-10-CM

## 2022-04-29 DIAGNOSIS — E118 Type 2 diabetes mellitus with unspecified complications: Secondary | ICD-10-CM

## 2022-04-30 ENCOUNTER — Telehealth: Payer: Self-pay

## 2022-04-30 NOTE — Telephone Encounter (Signed)
Called pt to advised of the need for a diabetic appt in Dec. LM for pt to call back. [place]

## 2022-05-01 ENCOUNTER — Other Ambulatory Visit: Payer: Self-pay | Admitting: Family Medicine

## 2022-05-01 DIAGNOSIS — E1159 Type 2 diabetes mellitus with other circulatory complications: Secondary | ICD-10-CM

## 2022-05-15 ENCOUNTER — Other Ambulatory Visit: Payer: Self-pay | Admitting: Family Medicine

## 2022-05-15 DIAGNOSIS — E118 Type 2 diabetes mellitus with unspecified complications: Secondary | ICD-10-CM

## 2022-07-24 ENCOUNTER — Other Ambulatory Visit: Payer: Self-pay

## 2022-07-24 ENCOUNTER — Telehealth: Payer: Self-pay | Admitting: Family Medicine

## 2022-07-24 DIAGNOSIS — E1159 Type 2 diabetes mellitus with other circulatory complications: Secondary | ICD-10-CM

## 2022-07-24 DIAGNOSIS — I152 Hypertension secondary to endocrine disorders: Secondary | ICD-10-CM

## 2022-07-24 MED ORDER — AMLODIPINE BESYLATE 5 MG PO TABS: 5.0000 mg | ORAL_TABLET | Freq: Every day | ORAL | 1 refills | Status: DC

## 2022-07-24 MED ORDER — LISINOPRIL-HYDROCHLOROTHIAZIDE 20-12.5 MG PO TABS: 1.0000 | ORAL_TABLET | Freq: Every day | ORAL | 1 refills | Status: DC

## 2022-07-24 NOTE — Telephone Encounter (Signed)
Pt requesting medication refill on her amlodipine and lisinopril to  [place], [address]

## 2022-07-26 ENCOUNTER — Other Ambulatory Visit: Payer: Self-pay | Admitting: Family Medicine

## 2022-07-26 DIAGNOSIS — E118 Type 2 diabetes mellitus with unspecified complications: Secondary | ICD-10-CM

## 2022-09-22 ENCOUNTER — Other Ambulatory Visit: Payer: Self-pay | Admitting: Family Medicine

## 2022-09-22 DIAGNOSIS — Z1231 Encounter for screening mammogram for malignant neoplasm of breast: Secondary | ICD-10-CM

## 2022-10-02 ENCOUNTER — Ambulatory Visit
Admission: RE | Admit: 2022-10-02 | Discharge: 2022-10-02 | Disposition: A | Discharge status: Home / Self Care | Payer: PPO | Source: Ambulatory Visit | Attending: Family Medicine | Admitting: Family Medicine

## 2022-10-02 DIAGNOSIS — Z1231 Encounter for screening mammogram for malignant neoplasm of breast: Secondary | ICD-10-CM

## 2022-10-05 DIAGNOSIS — H401131 Primary open-angle glaucoma, bilateral, mild stage: Secondary | ICD-10-CM | POA: Diagnosis not present

## 2022-10-23 ENCOUNTER — Telehealth: Payer: Self-pay | Admitting: Family Medicine

## 2022-10-23 ENCOUNTER — Other Ambulatory Visit: Payer: Self-pay

## 2022-10-23 MED ORDER — ONETOUCH VERIO VI STRP: ORAL_STRIP | 2 refills | Status: DC

## 2022-10-23 NOTE — Telephone Encounter (Signed)
Pt called for refills of one touch verio test strips. Pt test twice a day. Please send to [address]. Pt can be reached at [phone].

## 2022-10-25 ENCOUNTER — Other Ambulatory Visit: Payer: Self-pay | Admitting: Family Medicine

## 2022-10-25 DIAGNOSIS — E118 Type 2 diabetes mellitus with unspecified complications: Secondary | ICD-10-CM

## 2022-12-09 ENCOUNTER — Other Ambulatory Visit: Payer: Self-pay | Admitting: Family Medicine

## 2022-12-09 DIAGNOSIS — E1169 Type 2 diabetes mellitus with other specified complication: Secondary | ICD-10-CM

## 2022-12-18 ENCOUNTER — Telehealth: Payer: Self-pay | Admitting: Family Medicine

## 2022-12-18 NOTE — Telephone Encounter (Signed)
Contacted Tedra Coupe [facility]aker to schedule their annual wellness visit. Appointment made for 12/22/22.  Rudell Cobb AWV direct phone # [phone]

## 2022-12-22 ENCOUNTER — Ambulatory Visit (INDEPENDENT_AMBULATORY_CARE_PROVIDER_SITE_OTHER): Payer: PPO

## 2022-12-22 VITALS — BP 136/80 | HR 80 | Temp 98.0°F | Ht 65.5 in | Wt 182.4 lb

## 2022-12-22 DIAGNOSIS — Z0000 Encounter for general adult medical examination without abnormal findings: Secondary | ICD-10-CM

## 2022-12-22 NOTE — Progress Notes (Signed)
Subjective:   [name] is a 72 y.o. female who presents for Medicare Annual (Subsequent) preventive examination.  Review of Systems     Cardiac Risk Factors include: advanced age (>43men, >60 women);diabetes mellitus;dyslipidemia;hypertension     Objective:    Today's Vitals   12/22/22 0806  BP: 136/80  Pulse: 80  Temp: 98 F (36.7 C)  TempSrc: Oral  SpO2: 96%  Weight: 182 lb 6.4 oz (82.7 kg)  Height: 5' 5.5" (1.664 m)   Body mass index is 29.89 kg/m.     12/22/2022    8:14 AM 02/27/2022    1:33 PM 06/07/2020   11:50 AM 07/20/2016    1:13 PM  Advanced Directives  Does Patient Have a Medical Advance Directive? No No No No  Would patient like information on creating a medical advance directive? No - Patient declined No - Patient declined Yes (ED - Information included in AVS)     Current Medications (verified) Outpatient Encounter Medications as of 12/22/2022  Medication Sig   amLODipine (NORVASC) 5 MG tablet Take 1 tablet (5 mg total) by mouth daily.   atorvastatin (LIPITOR) 40 MG tablet Take 1 tablet by mouth once daily   Blood Glucose Monitoring Suppl (CONTOUR BLOOD GLUCOSE SYSTEM) DEVI Test twice daily DX: E11.9   dorzolamide-timolol (COSOPT) 22.3-6.8 MG/ML ophthalmic solution 1 drop 2 (two) times daily.   glucose blood (ONETOUCH VERIO) test strip USE AS DIRECTED TWICE DAILY   lisinopril-hydrochlorothiazide (ZESTORETIC) 20-12.5 MG tablet Take 1 tablet by mouth daily.   metFORMIN (GLUCOPHAGE) 1000 MG tablet Take 1 tablet by mouth twice daily   Multiple Vitamins-Minerals (MULTIVITAMIN WITH MINERALS) tablet Take 1 tablet by mouth daily.     OneTouch Delica Lancets 33G MISC Use as directed twice daily   albuterol (PROVENTIL HFA;VENTOLIN HFA) 108 (90 Base) MCG/ACT inhaler Inhale 2 puffs into the lungs every 6 (six) hours as needed for wheezing or shortness of breath. (Patient not taking: Reported on 03/07/2018)   chlorhexidine (PERIDEX) 0.12 % solution 15 mLs  2 (two) times daily. (Patient not taking: Reported on 02/27/2022)   Cholecalciferol (VITAMIN D3 PO) Take 1,000 mg by mouth daily. (Patient not taking: Reported on 12/22/2022)   dapagliflozin propanediol (FARXIGA) 5 MG TABS tablet Take 1 tablet (5 mg total) by mouth daily before breakfast. (Patient not taking: Reported on 12/22/2022)   hyoscyamine (LEVSIN SL) 0.125 MG SL tablet PLACE 1 TABLET UNDER THE TONGUE EVERY 4 HOURS AS NEEDED (Patient not taking: No sig reported)   No facility-administered encounter medications on file as of 12/22/2022.    Allergies (verified) Brimonidine   History: Past Medical History:  Diagnosis Date   Allergic rhinitis    Allergy    pollen    Asthma    with pollen   Cataract    Diabetes mellitus    Diverticulosis of colon    GERD (gastroesophageal reflux disease)    Glaucoma    Helicobacter pylori gastritis    HLD (hyperlipidemia)    Hx of adenomatous polyp of colon 08/06/2016   Hypertension    Obesity    Past Surgical History:  Procedure Laterality Date   CATARACT EXTRACTION Bilateral    COLONOSCOPY  04/08/2006; 07/2016   POLYPECTOMY     UPPER GASTROINTESTINAL ENDOSCOPY  06/16/2011   H. pylori gastritis with polyp and hyperakeratosis of esophagus   VAGINAL HYSTERECTOMY  09/27/2006   and left paraovarian cystectomy   Family History  Problem Relation Age of Onset   Diabetes Mother  Heart disease Mother    Colon cancer Paternal Aunt    Colon cancer Paternal Uncle    Colon polyps Cousin    Rectal cancer Neg Hx    Stomach cancer Neg Hx    Esophageal cancer Neg Hx    Crohn's disease Neg Hx    Social History   Socioeconomic History   Marital status: Single    Spouse name: Not on file   Number of children: 2   Years of education: Not on file   Highest education level: Not on file  Occupational History   Occupation: Sport and exercise psychologist  Tobacco Use   Smoking status: Never    Passive exposure: Never   Smokeless tobacco: Never  Vaping  Use   Vaping Use: Never used  Substance and Sexual Activity   Alcohol use: No   Drug use: No   Sexual activity: Not Currently  Other Topics Concern   Not on file  Social History Narrative   2 caffeine drinks daily    Social Determinants of Health   Financial Resource Strain: Low Risk  (12/22/2022)   Overall Financial Resource Strain (CARDIA)    Difficulty of Paying Living Expenses: Not hard at all  Food Insecurity: No Food Insecurity (12/22/2022)   Hunger Vital Sign    Worried About Running Out of Food in the Last Year: Never true    Ran Out of Food in the Last Year: Never true  Transportation Needs: No Transportation Needs (12/22/2022)   PRAPARE - Administrator, Civil Service (Medical): No    Lack of Transportation (Non-Medical): No  Physical Activity: Inactive (12/22/2022)   Exercise Vital Sign    Days of Exercise per Week: 0 days    Minutes of Exercise per Session: 0 min  Stress: No Stress Concern Present (12/22/2022)   [employer] of Occupational Health - Occupational Stress Questionnaire    Feeling of Stress : Not at all  Social Connections: Not on file    Tobacco Counseling Counseling given: Not Answered   Clinical Intake:  Pre-visit preparation completed: Yes  Pain : No/denies pain     Nutritional Status: BMI 25 -29 Overweight Nutritional Risks: None Diabetes: Yes  How often do you need to have someone help you when you read instructions, pamphlets, or other written materials from your doctor or pharmacy?: 3 - Sometimes  Diabetic? Yes Nutrition Risk Assessment:  Has the patient had any N/V/D within the last 2 months?  No  Does the patient have any non-healing wounds?  No  Has the patient had any unintentional weight loss or weight gain?  No   Diabetes:  Is the patient diabetic?  Yes  If diabetic, was a CBG obtained today?  No  Did the patient bring in their glucometer from home?  No  How often do you monitor your CBG's? daily.    Financial Strains and Diabetes Management:  Are you having any financial strains with the device, your supplies or your medication? No .  Does the patient want to be seen by Chronic Care Management for management of their diabetes?  No  Would the patient like to be referred to a Nutritionist or for Diabetic Management?  No   Diabetic Exams:  Diabetic Eye Exam: Overdue for diabetic eye exam. Pt has been advised about the importance in completing this exam. Patient advised to call and schedule an eye exam. Diabetic Foot Exam: Completed 07/21/2021   Interpreter Needed?: Yes Interpreter Agency: [employer] [employer] Interpreter Name: Olegario Messier  [place]  Information entered by :: NAllen LPN   Activities of Daily Living    12/22/2022    8:17 AM 02/27/2022    1:37 PM  In your present state of health, do you have any difficulty performing the following activities:  Hearing? 0 0  Vision? 0 0  Difficulty concentrating or making decisions? 0 1  Comment  sometimes  Walking or climbing stairs? 0 0  Dressing or bathing? 0 0  Doing errands, shopping? 0 1  Comment  daughter always goes  Quarry manager and eating ? N N  Using the Toilet? N N  In the past six months, have you accidently leaked urine? N Y  Do you have problems with loss of bowel control? N N  Managing your Medications? N N  Managing your Finances? N N  Housekeeping or managing your Housekeeping? N N    Patient Care Team: Ronnald Nian, MD as PCP - General (Family Medicine)  Indicate any recent Medical Services you may have received from other than [facility] providers in the past year (date may be approximate).     Assessment:   This is a routine wellness examination for [facility].  Hearing/Vision screen Vision Screening - Comments:: Regular eye exams, [facility]  Dietary issues and exercise activities discussed: Current Exercise Habits: The patient does not participate in regular exercise at present   Goals Addressed              This Visit's Progress    Patient Stated       12/22/2022, no goals       Depression Screen    12/22/2022    8:16 AM 03/13/2022    8:36 AM 02/27/2022    1:35 PM 11/24/2021    1:36 PM 07/21/2021    9:44 AM 06/07/2020   10:37 AM 01/03/2018    1:27 PM  PHQ 2/9 Scores  PHQ - 2 Score 0 0 0 0 0 0 0  PHQ- 9 Score 0  2        Fall Risk    12/22/2022    8:15 AM 02/27/2022    1:35 PM 07/21/2021    9:44 AM 06/07/2020   10:37 AM 04/02/2020    1:03 PM  Fall Risk   Falls in the past year? 0 0 0 0 0  Comment     Emmi Telephone Survey: data to providers prior to load  Number falls in past yr: 0 0 0    Injury with Fall? 0 0 0    Risk for fall due to : Medication side effect Medication side effect No Fall Risks    Follow up Falls prevention discussed;Education provided;Falls evaluation completed Falls evaluation completed;Education provided;Falls prevention discussed Falls evaluation completed      FALL RISK PREVENTION PERTAINING TO THE HOME:  Any stairs in or around the home? Yes  If so, are there any without handrails? Yes  Home free of loose throw rugs in walkways, pet beds, electrical cords, etc? Yes  Adequate lighting in your home to reduce risk of falls? Yes   ASSISTIVE DEVICES UTILIZED TO PREVENT FALLS:  Life alert? Yes  Use of a cane, walker or w/c? No  Grab bars in the bathroom? Yes  Shower chair or bench in shower? No  Elevated toilet seat or a handicapped toilet? No   TIMED UP AND GO:  Was the test performed? Yes .  Length of time to ambulate 10 feet: 5 sec.   Gait  steady and fast without use of assistive device  Cognitive Function:        12/22/2022    8:18 AM  6CIT Screen  What Year? 0 points  What month? 0 points  What time? 0 points  Count back from 20 0 points  Months in reverse 0 points  Repeat phrase 6 points  Total Score 6 points    Immunizations Immunization History  Administered Date(s) Administered   DTaP 02/10/2006   Fluad Quad(high Dose  65+) 05/23/2019, 06/07/2020, 06/20/2021   Influenza Split 08/03/2011   Influenza, High Dose Seasonal PF 10/11/2018   Influenza,inj,Quad PF,6+ Mos 07/04/2013, 06/05/2014, 06/29/2016, 07/15/2017   Moderna SARS-COV2 Booster Vaccination 08/08/2020   Moderna Sars-Covid-2 Vaccination 11/15/2019, 12/13/2019   Pneumococcal Conjugate-13 08/12/2015   Pneumococcal Polysaccharide-23 07/04/2013   Tdap 06/29/2016   Zoster Recombinat (Shingrix) 08/03/2020, 01/19/2021   Zoster, Live 08/01/2014    TDAP status: Up to date  Flu Vaccine status: Up to date  Pneumococcal vaccine status: Up to date  Covid-19 vaccine status: Completed vaccines  Qualifies for Shingles Vaccine? Yes   Zostavax completed Yes   Shingrix Completed?: Yes  Screening Tests Health Maintenance  Topic Date Due   OPHTHALMOLOGY EXAM  02/14/2022   Diabetic kidney evaluation - Urine ACR  07/21/2022   FOOT EXAM  07/21/2022   HEMOGLOBIN A1C  09/13/2022   Diabetic kidney evaluation - eGFR measurement  03/14/2023   INFLUENZA VACCINE  04/08/2023   Medicare Annual Wellness (AWV)  12/22/2023   MAMMOGRAM  10/02/2024   DTaP/Tdap/Td (3 - Td or Tdap) 06/29/2026   COLONOSCOPY (Pts 45-24yrs Insurance coverage will need to be confirmed)  01/05/2029   DEXA SCAN  Completed   Hepatitis C Screening  Completed   Zoster Vaccines- Shingrix  Completed   HPV VACCINES  Aged Out   Pneumonia Vaccine 46+ Years old  Discontinued   COVID-19 Vaccine  Discontinued    Health Maintenance  Health Maintenance Due  Topic Date Due   OPHTHALMOLOGY EXAM  02/14/2022   Diabetic kidney evaluation - Urine ACR  07/21/2022   FOOT EXAM  07/21/2022   HEMOGLOBIN A1C  09/13/2022    Colorectal cancer screening: Type of screening: Colonoscopy. Completed 01/05/2022. Repeat every 7 years  Mammogram status: Completed 10/02/2022. Repeat every year  Bone Density status: Completed 11/01/2020.   Lung Cancer Screening: (Low Dose CT Chest recommended if Age 28-80 years, 30  pack-year currently smoking OR have quit w/in 15years.) does not qualify.   Lung Cancer Screening Referral: no  Additional Screening:  Hepatitis C Screening: does qualify; Completed 01/03/2018  Vision Screening: Recommended annual ophthalmology exams for early detection of glaucoma and other disorders of the eye. Is the patient up to date with their annual eye exam?  Yes  Who is the provider or what is the name of the office in which the patient attends annual eye exams? [facility] If pt is not established with a provider, would they like to be referred to a provider to establish care? No .   Dental Screening: Recommended annual dental exams for proper oral hygiene  Community Resource Referral / Chronic Care Management: CRR required this visit?  No   CCM required this visit?  No      Plan:     I have personally reviewed and noted the following in the patient's chart:   Medical and social history Use of alcohol, tobacco or illicit drugs  Current medications and supplements including opioid prescriptions. Patient is not  currently taking opioid prescriptions. Functional ability and status Nutritional status Physical activity Advanced directives List of other physicians Hospitalizations, surgeries, and ER visits in previous 12 months Vitals Screenings to include cognitive, depression, and falls Referrals and appointments  In addition, I have reviewed and discussed with patient certain preventive protocols, quality metrics, and best practice recommendations. A written personalized care plan for preventive services as well as general preventive health recommendations were provided to patient.     Barb Merino, LPN   12/14/8117   Nurse Notes: none

## 2022-12-22 NOTE — Patient Instructions (Signed)
[name] , Thank you for taking time to come for your Medicare Wellness Visit. I appreciate your ongoing commitment to your health goals. Please review the following plan we discussed and let me know if I can assist you in the future.   These are the goals we discussed:  Goals      Patient Stated     02/27/2022, wants a vacation, walk more     Patient Stated     12/22/2022, no goals        This is a list of the screening recommended for you and due dates:  Health Maintenance  Topic Date Due   Eye exam for diabetics  02/14/2022   Yearly kidney health urinalysis for diabetes  07/21/2022   Complete foot exam   07/21/2022   Hemoglobin A1C  09/13/2022   Yearly kidney function blood test for diabetes  03/14/2023   Flu Shot  04/08/2023   Medicare Annual Wellness Visit  12/22/2023   Mammogram  10/02/2024   DTaP/Tdap/Td vaccine (3 - Td or Tdap) 06/29/2026   Colon Cancer Screening  01/05/2029   DEXA scan (bone density measurement)  Completed   Hepatitis C Screening: USPSTF Recommendation to screen - Ages 45-79 yo.  Completed   Zoster (Shingles) Vaccine  Completed   HPV Vaccine  Aged Out   Pneumonia Vaccine  Discontinued   COVID-19 Vaccine  Discontinued    Advanced directives: Advance directive discussed with you today. Even though you declined this today please call our office should you change your mind and we can give you the proper paperwork for you to fill out.   Conditions/risks identified: none  Next appointment: Follow up in one year for your annual wellness visit    Preventive Care 65 Years and Older, Female Preventive care refers to lifestyle choices and visits with your health care provider that can promote health and wellness. What does preventive care include? A yearly physical exam. This is also called an annual well check. Dental exams once or twice a year. Routine eye exams. Ask your health care provider how often you should have your eyes checked. Personal  lifestyle choices, including: Daily care of your teeth and gums. Regular physical activity. Eating a healthy diet. Avoiding tobacco and drug use. Limiting alcohol use. Practicing safe sex. Taking low-dose aspirin every day. Taking vitamin and mineral supplements as recommended by your health care provider. What happens during an annual well check? The services and screenings done by your health care provider during your annual well check will depend on your age, overall health, lifestyle risk factors, and family history of disease. Counseling  Your health care provider may ask you questions about your: Alcohol use. Tobacco use. Drug use. Emotional well-being. Home and relationship well-being. Sexual activity. Eating habits. History of falls. Memory and ability to understand (cognition). Work and work Astronomer. Reproductive health. Screening  You may have the following tests or measurements: Height, weight, and BMI. Blood pressure. Lipid and cholesterol levels. These may be checked every 5 years, or more frequently if you are over 81 years old. Skin check. Lung cancer screening. You may have this screening every year starting at age 32 if you have a 30-pack-year history of smoking and currently smoke or have quit within the past 15 years. Fecal occult blood test (FOBT) of the stool. You may have this test every year starting at age 13. Flexible sigmoidoscopy or colonoscopy. You may have a sigmoidoscopy every 5 years or a colonoscopy every 10  years starting at age 36. Hepatitis C blood test. Hepatitis B blood test. Sexually transmitted disease (STD) testing. Diabetes screening. This is done by checking your blood sugar (glucose) after you have not eaten for a while (fasting). You may have this done every 1-3 years. Bone density scan. This is done to screen for osteoporosis. You may have this done starting at age 56. Mammogram. This may be done every 1-2 years. Talk to your  health care provider about how often you should have regular mammograms. Talk with your health care provider about your test results, treatment options, and if necessary, the need for more tests. Vaccines  Your health care provider may recommend certain vaccines, such as: Influenza vaccine. This is recommended every year. Tetanus, diphtheria, and acellular pertussis (Tdap, Td) vaccine. You may need a Td booster every 10 years. Zoster vaccine. You may need this after age 10. Pneumococcal 13-valent conjugate (PCV13) vaccine. One dose is recommended after age 5. Pneumococcal polysaccharide (PPSV23) vaccine. One dose is recommended after age 36. Talk to your health care provider about which screenings and vaccines you need and how often you need them. This information is not intended to replace advice given to you by your health care provider. Make sure you discuss any questions you have with your health care provider. Document Released: 09/20/2015 Document Revised: 05/13/2016 Document Reviewed: 06/25/2015 Elsevier Interactive Patient Education  2017 [place] Prevention in the Home Falls can cause injuries. They can happen to people of all ages. There are many things you can do to make your home safe and to help prevent falls. What can I do on the outside of my home? Regularly fix the edges of walkways and driveways and fix any cracks. Remove anything that might make you trip as you walk through a door, such as a raised step or threshold. Trim any bushes or trees on the path to your home. Use bright outdoor lighting. Clear any walking paths of anything that might make someone trip, such as rocks or tools. Regularly check to see if handrails are loose or broken. Make sure that both sides of any steps have handrails. Any raised decks and porches should have guardrails on the edges. Have any leaves, snow, or ice cleared regularly. Use sand or salt on walking paths during winter. Clean  up any spills in your garage right away. This includes oil or grease spills. What can I do in the bathroom? Use night lights. Install grab bars by the toilet and in the tub and shower. Do not use towel bars as grab bars. Use non-skid mats or decals in the tub or shower. If you need to sit down in the shower, use a plastic, non-slip stool. Keep the floor dry. Clean up any water that spills on the floor as soon as it happens. Remove soap buildup in the tub or shower regularly. Attach bath mats securely with double-sided non-slip rug tape. Do not have throw rugs and other things on the floor that can make you trip. What can I do in the bedroom? Use night lights. Make sure that you have a light by your bed that is easy to reach. Do not use any sheets or blankets that are too big for your bed. They should not hang down onto the floor. Have a firm chair that has side arms. You can use this for support while you get dressed. Do not have throw rugs and other things on the floor that can make you trip.  What can I do in the kitchen? Clean up any spills right away. Avoid walking on wet floors. Keep items that you use a lot in easy-to-reach places. If you need to reach something above you, use a strong step stool that has a grab bar. Keep electrical cords out of the way. Do not use floor polish or wax that makes floors slippery. If you must use wax, use non-skid floor wax. Do not have throw rugs and other things on the floor that can make you trip. What can I do with my stairs? Do not leave any items on the stairs. Make sure that there are handrails on both sides of the stairs and use them. Fix handrails that are broken or loose. Make sure that handrails are as long as the stairways. Check any carpeting to make sure that it is firmly attached to the stairs. Fix any carpet that is loose or worn. Avoid having throw rugs at the top or bottom of the stairs. If you do have throw rugs, attach them to the  floor with carpet tape. Make sure that you have a light switch at the top of the stairs and the bottom of the stairs. If you do not have them, ask someone to add them for you. What else can I do to help prevent falls? Wear shoes that: Do not have high heels. Have rubber bottoms. Are comfortable and fit you well. Are closed at the toe. Do not wear sandals. If you use a stepladder: Make sure that it is fully opened. Do not climb a closed stepladder. Make sure that both sides of the stepladder are locked into place. Ask someone to hold it for you, if possible. Clearly mark and make sure that you can see: Any grab bars or handrails. First and last steps. Where the edge of each step is. Use tools that help you move around (mobility aids) if they are needed. These include: Canes. Walkers. Scooters. Crutches. Turn on the lights when you go into a dark area. Replace any light bulbs as soon as they burn out. Set up your furniture so you have a clear path. Avoid moving your furniture around. If any of your floors are uneven, fix them. If there are any pets around you, be aware of where they are. Review your medicines with your doctor. Some medicines can make you feel dizzy. This can increase your chance of falling. Ask your doctor what other things that you can do to help prevent falls. This information is not intended to replace advice given to you by your health care provider. Make sure you discuss any questions you have with your health care provider. Document Released: 06/20/2009 Document Revised: 01/30/2016 Document Reviewed: 09/28/2014 Elsevier Interactive Patient Education  2017 [employer].

## 2023-01-21 ENCOUNTER — Other Ambulatory Visit: Payer: Self-pay | Admitting: Family Medicine

## 2023-01-21 DIAGNOSIS — E1159 Type 2 diabetes mellitus with other circulatory complications: Secondary | ICD-10-CM

## 2023-03-15 ENCOUNTER — Other Ambulatory Visit: Payer: Self-pay | Admitting: Family Medicine

## 2023-03-16 ENCOUNTER — Encounter: Payer: Self-pay | Admitting: Family Medicine

## 2023-03-16 ENCOUNTER — Ambulatory Visit (INDEPENDENT_AMBULATORY_CARE_PROVIDER_SITE_OTHER): Payer: PPO | Admitting: Family Medicine

## 2023-03-16 VITALS — BP 128/78 | HR 70 | Temp 97.9°F | Resp 16 | Ht 64.25 in | Wt 178.8 lb

## 2023-03-16 DIAGNOSIS — H409 Unspecified glaucoma: Secondary | ICD-10-CM | POA: Diagnosis not present

## 2023-03-16 DIAGNOSIS — E1159 Type 2 diabetes mellitus with other circulatory complications: Secondary | ICD-10-CM

## 2023-03-16 DIAGNOSIS — Z0000 Encounter for general adult medical examination without abnormal findings: Secondary | ICD-10-CM

## 2023-03-16 DIAGNOSIS — N3281 Overactive bladder: Secondary | ICD-10-CM

## 2023-03-16 DIAGNOSIS — Z9849 Cataract extraction status, unspecified eye: Secondary | ICD-10-CM

## 2023-03-16 DIAGNOSIS — K219 Gastro-esophageal reflux disease without esophagitis: Secondary | ICD-10-CM | POA: Diagnosis not present

## 2023-03-16 DIAGNOSIS — E11319 Type 2 diabetes mellitus with unspecified diabetic retinopathy without macular edema: Secondary | ICD-10-CM | POA: Diagnosis not present

## 2023-03-16 DIAGNOSIS — Z86010 Personal history of colonic polyps: Secondary | ICD-10-CM

## 2023-03-16 DIAGNOSIS — E6609 Other obesity due to excess calories: Secondary | ICD-10-CM | POA: Diagnosis not present

## 2023-03-16 DIAGNOSIS — E1136 Type 2 diabetes mellitus with diabetic cataract: Secondary | ICD-10-CM

## 2023-03-16 DIAGNOSIS — E1169 Type 2 diabetes mellitus with other specified complication: Secondary | ICD-10-CM

## 2023-03-16 DIAGNOSIS — E785 Hyperlipidemia, unspecified: Secondary | ICD-10-CM | POA: Diagnosis not present

## 2023-03-16 DIAGNOSIS — E118 Type 2 diabetes mellitus with unspecified complications: Secondary | ICD-10-CM

## 2023-03-16 DIAGNOSIS — I152 Hypertension secondary to endocrine disorders: Secondary | ICD-10-CM

## 2023-03-16 LAB — LIPID PANEL

## 2023-03-16 LAB — POCT UA - MICROALBUMIN
Albumin/Creatinine Ratio, Urine, POC: 9.2
Creatinine, POC: 96.9 mg/dL
Microalbumin Ur, POC: 8.9 mg/L

## 2023-03-16 LAB — COMPREHENSIVE METABOLIC PANEL
ALT: 18 IU/L (ref 0–32)
Albumin: 4.3 g/dL (ref 3.9–4.9)
Alkaline Phosphatase: 95 IU/L (ref 44–121)
BUN/Creatinine Ratio: 18 (ref 12–28)
BUN: 12 mg/dL (ref 8–27)
CO2: 22 mmol/L (ref 20–29)
Calcium: 9.8 mg/dL (ref 8.7–10.3)
Globulin, Total: 2.9 g/dL (ref 1.5–4.5)
Glucose: 119 mg/dL — ABNORMAL HIGH (ref 70–99)
Sodium: 141 mmol/L (ref 134–144)
Total Protein: 7.2 g/dL (ref 6.0–8.5)
eGFR: 94 mL/min/{1.73_m2} (ref >59)

## 2023-03-16 LAB — CBC WITH DIFFERENTIAL/PLATELET
Basophils Absolute: 0.1 10*3/uL (ref 0.0–0.2)
EOS (ABSOLUTE): 0.3 10*3/uL (ref 0.0–0.4)
Eos: 5 %
Hematocrit: 37.5 % (ref 34.0–46.6)
Immature Grans (Abs): 0 10*3/uL (ref 0.0–0.1)
Lymphocytes Absolute: 2.3 10*3/uL (ref 0.7–3.1)
Lymphs: 44 %
MCHC: 32.8 g/dL (ref 31.5–35.7)
Monocytes Absolute: 0.4 10*3/uL (ref 0.1–0.9)
Neutrophils Absolute: 2.2 10*3/uL (ref 1.4–7.0)
Platelets: 344 10*3/uL (ref 150–450)
RBC: 4.28 x10E6/uL (ref 3.77–5.28)
RDW: 13.9 % (ref 11.7–15.4)

## 2023-03-16 LAB — POCT GLYCOSYLATED HEMOGLOBIN (HGB A1C): Hemoglobin A1C: 7.6 % — AB (ref 4.0–5.6)

## 2023-03-16 MED ORDER — METFORMIN HCL 1000 MG PO TABS: 1000.0000 mg | ORAL_TABLET | Freq: Two times a day (BID) | ORAL | 1 refills | Status: DC

## 2023-03-16 MED ORDER — LISINOPRIL-HYDROCHLOROTHIAZIDE 20-12.5 MG PO TABS: 1.0000 | ORAL_TABLET | Freq: Every day | ORAL | 3 refills | Status: DC

## 2023-03-16 MED ORDER — AMLODIPINE BESYLATE 5 MG PO TABS: 5.0000 mg | ORAL_TABLET | Freq: Every day | ORAL | 3 refills | Status: DC

## 2023-03-16 MED ORDER — DAPAGLIFLOZIN PROPANEDIOL 10 MG PO TABS
10.0000 mg | ORAL_TABLET | Freq: Every day | ORAL | 1 refills | Status: DC
Start: 2023-03-16 — End: 2023-09-06

## 2023-03-16 MED ORDER — ATORVASTATIN CALCIUM 40 MG PO TABS: 40.0000 mg | ORAL_TABLET | Freq: Every day | ORAL | 3 refills | Status: DC

## 2023-03-16 NOTE — Patient Instructions (Addendum)
  [name] , Thank you for taking time to come for your Medicare Wellness Visit. I appreciate your ongoing commitment to your health goals. Please review the following plan we discussed and let me know if I can assist you in the future.   These are the goals we discussed:  Goals      Patient Stated     02/27/2022, wants a vacation, walk more     Patient Stated     12/22/2022, no goals        This is a list of the screening recommended for you and due dates:  Health Maintenance  Topic Date Due   Eye exam for diabetics  02/14/2022   Yearly kidney function blood test for diabetes  03/14/2023   Flu Shot  04/08/2023   Hemoglobin A1C  09/16/2023   Medicare Annual Wellness Visit  12/22/2023   Yearly kidney health urinalysis for diabetes  03/15/2024   Complete foot exam   03/15/2024   Mammogram  10/02/2024   DTaP/Tdap/Td vaccine (3 - Td or Tdap) 06/29/2026   Colon Cancer Screening  01/05/2029   DEXA scan (bone density measurement)  Completed   Hepatitis C Screening  Completed   Zoster (Shingles) Vaccine  Completed   HPV Vaccine  Aged Out   Pneumonia Vaccine  Discontinued   COVID-19 Vaccine  Discontinued

## 2023-03-16 NOTE — Progress Notes (Signed)
Complete physical exam  Patient: [name]   DOB: [birth year]   72 y.o. Female  MRN: [id number]  Subjective:    Chief Complaint  Patient presents with   Annual Exam    Had AWV with [facility] on 12/22/2022. Fasting.   She is here with her interpreter, Carrie Protsman is a 72 y.o. female who presents today for a complete physical exam. She reports consuming a general diet. Home exercise routine includes walking 20 minutes daily. She generally feels fairly well. She reports sleeping fairly well. She does not have additional problems to discuss today.  She is scheduled for an eye exam at the end of the month.  She also is scheduled for follow-up colonoscopy for polyps.  She is out walking more and is now retired.  Continues on lisinopril/HCTZ and amlodipine and having no difficulty with that.  She is also taking metformin and Farxiga and has no complaints.  Continues on atorvastatin.  She does have OAB symptoms but not to the point that she wants to take any medications.  She does not smoke or drink.  Reflux causes very little difficulty.  Otherwise her family and social history as well as health maintenance and immunizations was reviewed.   Most recent fall risk assessment:    12/22/2022    8:15 AM  Fall Risk   Falls in the past year? 0  Number falls in past yr: 0  Injury with Fall? 0  Risk for fall due to : Medication side effect  Follow up Falls prevention discussed;Education provided;Falls evaluation completed     Most recent depression screenings:    12/22/2022    8:16 AM 03/13/2022    8:36 AM  PHQ 2/9 Scores  PHQ - 2 Score 0 0  PHQ- 9 Score 0     Vision:Within last year and Dental: No regular dental care     Patient Care Team: Ronnald Nian, MD as PCP - General (Family Medicine)   Outpatient Medications Prior to Visit  Medication Sig   Blood Glucose Monitoring Suppl (CONTOUR BLOOD GLUCOSE SYSTEM) DEVI Test twice daily DX: E11.9    Cholecalciferol (VITAMIN D3 PO) Take 1,000 mg by mouth daily.   dorzolamide-timolol (COSOPT) 22.3-6.8 MG/ML ophthalmic solution 1 drop 2 (two) times daily.   glucose blood (ONETOUCH VERIO) test strip USE AS DIRECTED TWICE DAILY   Multiple Vitamins-Minerals (MULTIVITAMIN WITH MINERALS) tablet Take 1 tablet by mouth daily.     OneTouch Delica Lancets 33G MISC Use as directed twice daily   [DISCONTINUED] dapagliflozin propanediol (FARXIGA) 5 MG TABS tablet Take 1 tablet (5 mg total) by mouth daily before breakfast.   albuterol (PROVENTIL HFA;VENTOLIN HFA) 108 (90 Base) MCG/ACT inhaler Inhale 2 puffs into the lungs every 6 (six) hours as needed for wheezing or shortness of breath. (Patient not taking: Reported on 03/07/2018)   hyoscyamine (LEVSIN SL) 0.125 MG SL tablet PLACE 1 TABLET UNDER THE TONGUE EVERY 4 HOURS AS NEEDED (Patient not taking: No sig reported)   [DISCONTINUED] amLODipine (NORVASC) 5 MG tablet Take 1 tablet (5 mg total) by mouth daily.   [DISCONTINUED] atorvastatin (LIPITOR) 40 MG tablet Take 1 tablet by mouth once daily   [DISCONTINUED] chlorhexidine (PERIDEX) 0.12 % solution 15 mLs 2 (two) times daily. (Patient not taking: Reported on 02/27/2022)   [DISCONTINUED] lisinopril-hydrochlorothiazide (ZESTORETIC) 20-12.5 MG tablet Take 1 tablet by mouth once daily   [DISCONTINUED] metFORMIN (GLUCOPHAGE) 1000 MG tablet Take 1 tablet by mouth twice daily  No facility-administered medications prior to visit.    Review of Systems  All other systems reviewed and are negative.         Objective:     BP 128/78   Pulse 70   Temp 97.9 F (36.6 C) (Oral)   Resp 16   Ht 5' 4.25" (1.632 m)   Wt 178 lb 12.8 oz (81.1 kg)   SpO2 96% Comment: room air  BMI 30.45 kg/m    Physical Exam  Alert and in no distress. Tympanic membranes and canals are normal. Pharyngeal area is normal. Neck is supple without adenopathy or thyromegaly. Cardiac exam shows a regular sinus rhythm without murmurs  or gallops. Lungs are clear to auscultation. Foot exam is recorded ;hemoglobin A1c is 7.6     Assessment & Plan:    Routine Health Maintenance and Physical Exam  Immunization History  Administered Date(s) Administered   DTaP 02/10/2006   Fluad Quad(high Dose 65+) 05/23/2019, 06/07/2020, 06/20/2021   Influenza Split 08/03/2011   Influenza, High Dose Seasonal PF 10/11/2018   Influenza,inj,Quad PF,6+ Mos 07/04/2013, 06/05/2014, 06/29/2016, 07/15/2017   Moderna SARS-COV2 Booster Vaccination 08/08/2020   Moderna Sars-Covid-2 Vaccination 11/15/2019, 12/13/2019   Pneumococcal Conjugate-13 08/12/2015   Pneumococcal Polysaccharide-23 07/04/2013   Tdap 06/29/2016   Zoster Recombinant(Shingrix) 08/03/2020, 01/19/2021   Zoster, Live 08/01/2014    Health Maintenance  Topic Date Due   OPHTHALMOLOGY EXAM  02/14/2022   Diabetic kidney evaluation - eGFR measurement  03/14/2023   INFLUENZA VACCINE  04/08/2023   HEMOGLOBIN A1C  09/16/2023   Medicare Annual Wellness (AWV)  12/22/2023   Diabetic kidney evaluation - Urine ACR  03/15/2024   FOOT EXAM  03/15/2024   MAMMOGRAM  10/02/2024   DTaP/Tdap/Td (3 - Td or Tdap) 06/29/2026   Colonoscopy  01/05/2029   DEXA SCAN  Completed   Hepatitis C Screening  Completed   Zoster Vaccines- Shingrix  Completed   HPV VACCINES  Aged Out   Pneumonia Vaccine 7+ Years old  Discontinued   COVID-19 Vaccine  Discontinued    Encouraged her to continue with her diet and exercise as well as present medications.  Discussed weight loss and she states that she wants to set a goal of size 10.  Recommend she get RSV and discussed getting the flu and COVID shot this fall. Problem List Items Addressed This Visit     Class 1 obesity with serious comorbidity and body mass index (BMI) of 30.0 to 30.9 in adult   Relevant Medications   metFORMIN (GLUCOPHAGE) 1000 MG tablet   dapagliflozin propanediol (FARXIGA) 10 MG TABS tablet   Diabetes mellitus (HCC)   Relevant  Medications   atorvastatin (LIPITOR) 40 MG tablet   lisinopril-hydrochlorothiazide (ZESTORETIC) 20-12.5 MG tablet   metFORMIN (GLUCOPHAGE) 1000 MG tablet   dapagliflozin propanediol (FARXIGA) 10 MG TABS tablet   Other Relevant Orders   CBC with Differential/Platelet   Comprehensive metabolic panel   POCT glycosylated hemoglobin (Hb A1C) (Completed)   POCT UA - Microalbumin (Completed)   Gastroesophageal reflux disease without esophagitis   Glaucoma (increased eye pressure)   Hx of adenomatous colonic polyps   Hyperlipidemia associated with type 2 diabetes mellitus (HCC)   Relevant Medications   amLODipine (NORVASC) 5 MG tablet   atorvastatin (LIPITOR) 40 MG tablet   lisinopril-hydrochlorothiazide (ZESTORETIC) 20-12.5 MG tablet   metFORMIN (GLUCOPHAGE) 1000 MG tablet   dapagliflozin propanediol (FARXIGA) 10 MG TABS tablet   Other Relevant Orders   Lipid panel   Hypertension associated  with diabetes (HCC)   Relevant Medications   amLODipine (NORVASC) 5 MG tablet   atorvastatin (LIPITOR) 40 MG tablet   lisinopril-hydrochlorothiazide (ZESTORETIC) 20-12.5 MG tablet   metFORMIN (GLUCOPHAGE) 1000 MG tablet   dapagliflozin propanediol (FARXIGA) 10 MG TABS tablet   OAB (overactive bladder)   Other Visit Diagnoses     Routine general medical examination at a health care facility    -  Primary   Controlled diabetes mellitus type 2 with complications, unspecified whether long term insulin use (HCC)       Relevant Medications   atorvastatin (LIPITOR) 40 MG tablet   lisinopril-hydrochlorothiazide (ZESTORETIC) 20-12.5 MG tablet   metFORMIN (GLUCOPHAGE) 1000 MG tablet   dapagliflozin propanediol (FARXIGA) 10 MG TABS tablet   History of cataract extraction, unspecified laterality          Follow-up 6 months    Sharlot Gowda, MD

## 2023-03-17 ENCOUNTER — Other Ambulatory Visit: Payer: Self-pay | Admitting: Family Medicine

## 2023-03-17 DIAGNOSIS — E11319 Type 2 diabetes mellitus with unspecified diabetic retinopathy without macular edema: Secondary | ICD-10-CM

## 2023-03-17 LAB — COMPREHENSIVE METABOLIC PANEL
AST: 17 IU/L (ref 0–40)
Bilirubin Total: 0.4 mg/dL (ref 0.0–1.2)
Chloride: 103 mmol/L (ref 96–106)
Creatinine, Ser: 0.68 mg/dL (ref 0.57–1.00)
Potassium: 4.4 mmol/L (ref 3.5–5.2)

## 2023-03-17 LAB — CBC WITH DIFFERENTIAL/PLATELET
Basos: 1 %
Hemoglobin: 12.3 g/dL (ref 11.1–15.9)
Immature Granulocytes: 0 %
MCH: 28.7 pg (ref 26.6–33.0)
MCV: 88 fL (ref 79–97)
Monocytes: 8 %
Neutrophils: 42 %
WBC: 5.3 10*3/uL (ref 3.4–10.8)

## 2023-03-17 LAB — LIPID PANEL: LDL Chol Calc (NIH): 105 mg/dL — ABNORMAL HIGH (ref 0–99)

## 2023-04-05 DIAGNOSIS — H401131 Primary open-angle glaucoma, bilateral, mild stage: Secondary | ICD-10-CM | POA: Diagnosis not present

## 2023-04-05 DIAGNOSIS — Z961 Presence of intraocular lens: Secondary | ICD-10-CM | POA: Diagnosis not present

## 2023-04-05 DIAGNOSIS — E119 Type 2 diabetes mellitus without complications: Secondary | ICD-10-CM | POA: Diagnosis not present

## 2023-09-05 ENCOUNTER — Other Ambulatory Visit: Payer: Self-pay | Admitting: Family Medicine

## 2023-10-07 ENCOUNTER — Encounter: Payer: 59 | Admitting: Family Medicine

## 2023-10-20 ENCOUNTER — Other Ambulatory Visit: Payer: Self-pay | Admitting: Family Medicine

## 2023-10-20 DIAGNOSIS — E118 Type 2 diabetes mellitus with unspecified complications: Secondary | ICD-10-CM

## 2023-11-11 ENCOUNTER — Other Ambulatory Visit: Payer: Self-pay | Admitting: Family Medicine

## 2023-11-11 DIAGNOSIS — Z1231 Encounter for screening mammogram for malignant neoplasm of breast: Secondary | ICD-10-CM

## 2023-12-03 ENCOUNTER — Ambulatory Visit
Admission: RE | Admit: 2023-12-03 | Discharge: 2023-12-03 | Disposition: A | Discharge status: Home / Self Care | Source: Ambulatory Visit | Attending: Family Medicine | Admitting: Family Medicine

## 2023-12-03 DIAGNOSIS — Z1231 Encounter for screening mammogram for malignant neoplasm of breast: Secondary | ICD-10-CM

## 2023-12-07 ENCOUNTER — Other Ambulatory Visit: Payer: Self-pay | Admitting: Family Medicine

## 2023-12-07 DIAGNOSIS — R928 Other abnormal and inconclusive findings on diagnostic imaging of breast: Secondary | ICD-10-CM

## 2023-12-20 ENCOUNTER — Ambulatory Visit
Admission: RE | Admit: 2023-12-20 | Discharge: 2023-12-20 | Disposition: A | Discharge status: Home / Self Care | Source: Ambulatory Visit | Attending: Family Medicine | Admitting: Family Medicine

## 2023-12-20 ENCOUNTER — Other Ambulatory Visit: Payer: Self-pay | Admitting: Family Medicine

## 2023-12-20 DIAGNOSIS — R928 Other abnormal and inconclusive findings on diagnostic imaging of breast: Secondary | ICD-10-CM

## 2023-12-20 DIAGNOSIS — N6325 Unspecified lump in the left breast, overlapping quadrants: Secondary | ICD-10-CM | POA: Diagnosis not present

## 2023-12-20 DIAGNOSIS — N6320 Unspecified lump in the left breast, unspecified quadrant: Secondary | ICD-10-CM

## 2023-12-21 ENCOUNTER — Ambulatory Visit
Admission: RE | Admit: 2023-12-21 | Discharge: 2023-12-21 | Disposition: A | Discharge status: Home / Self Care | Source: Ambulatory Visit | Attending: Family Medicine | Admitting: Family Medicine

## 2023-12-21 DIAGNOSIS — N6012 Diffuse cystic mastopathy of left breast: Secondary | ICD-10-CM | POA: Diagnosis not present

## 2023-12-21 DIAGNOSIS — N6320 Unspecified lump in the left breast, unspecified quadrant: Secondary | ICD-10-CM

## 2023-12-21 DIAGNOSIS — N6325 Unspecified lump in the left breast, overlapping quadrants: Secondary | ICD-10-CM | POA: Diagnosis not present

## 2023-12-21 DIAGNOSIS — R92322 Mammographic fibroglandular density, left breast: Secondary | ICD-10-CM | POA: Diagnosis not present

## 2023-12-21 HISTORY — PX: BREAST BIOPSY: SHX20

## 2023-12-22 ENCOUNTER — Telehealth: Payer: Self-pay | Admitting: Internal Medicine

## 2023-12-22 LAB — SURGICAL PATHOLOGY

## 2023-12-22 NOTE — Telephone Encounter (Signed)
 I am assuming she is wanting to know about her breast imaging results which just came in and are final. Dr. Robina Chol will probably look and reply to her soon   Copied from CRM [id number]. Topic: Clinical - Lab/Test Results >> Dec 22, 2023 11:05 AM Everlene Hobby D wrote: Please call patient back with lab results to go over them

## 2023-12-28 ENCOUNTER — Ambulatory Visit: Payer: PPO

## 2023-12-28 VITALS — BP 122/70 | HR 72 | Temp 97.9°F | Ht 64.5 in | Wt 178.0 lb

## 2023-12-28 DIAGNOSIS — Z0000 Encounter for general adult medical examination without abnormal findings: Secondary | ICD-10-CM

## 2023-12-28 NOTE — Patient Instructions (Signed)
 [name] , Thank you for taking time to come for your Medicare Wellness Visit. I appreciate your ongoing commitment to your health goals. Please review the following plan we discussed and let me know if I can assist you in the future.   Referrals/Orders/Follow-Ups/Clinician Recommendations: wants appointment with PCP to take about vitamin E  This is a list of the screening recommended for you and due dates:  Health Maintenance  Topic Date Due   Eye exam for diabetics  02/14/2022   Hemoglobin A1C  09/16/2023   Yearly kidney function blood test for diabetes  03/15/2024   Yearly kidney health urinalysis for diabetes  03/15/2024   Complete foot exam   03/15/2024   Flu Shot  04/07/2024   Medicare Annual Wellness Visit  12/27/2024   Mammogram  12/02/2025   DTaP/Tdap/Td vaccine (3 - Td or Tdap) 06/29/2026   Colon Cancer Screening  01/05/2029   DEXA scan (bone density measurement)  Completed   Hepatitis C Screening  Completed   Zoster (Shingles) Vaccine  Completed   HPV Vaccine  Aged Out   Meningitis B Vaccine  Aged Out   Pneumonia Vaccine  Discontinued   COVID-19 Vaccine  Discontinued    Advanced directives: (Declined) Advance directive discussed with you today. Even though you declined this today, please call our office should you change your mind, and we can give you the proper paperwork for you to fill out.  Next Medicare Annual Wellness Visit scheduled for next year: Yes  insert Preventive Care attachment Insert FALL PREVENTION attachment if needed

## 2023-12-28 NOTE — Progress Notes (Signed)
 Subjective:   Doyle Tegethoff is a 73 y.o. who presents for a Medicare Wellness preventive visit.  Visit Complete: In person    Persons Participating in Visit: Patient assisted by interpreter.  AWV Questionnaire: No: Patient Medicare AWV questionnaire was not completed prior to this visit.  Cardiac Risk Factors include: advanced age (>13men, >56 women);diabetes mellitus;dyslipidemia;hypertension     Objective:    Today's Vitals   12/28/23 0807  BP: 122/70  Pulse: 72  Temp: 97.9 F (36.6 C)  TempSrc: Oral  SpO2: 95%  Weight: 178 lb (80.7 kg)  Height: 5' 4.5" (1.638 m)   Body mass index is 30.08 kg/m.     12/28/2023    8:18 AM 12/22/2022    8:14 AM 02/27/2022    1:33 PM 06/07/2020   11:50 AM 07/20/2016    1:13 PM  Advanced Directives  Does Patient Have a Medical Advance Directive? No No No No No  Would patient like information on creating a medical advance directive? No - Patient declined No - Patient declined No - Patient declined Yes (ED - Information included in AVS)     Current Medications (verified) Outpatient Encounter Medications as of 12/28/2023  Medication Sig   amLODipine  (NORVASC ) 5 MG tablet Take 1 tablet (5 mg total) by mouth daily.   atorvastatin  (LIPITOR) 40 MG tablet Take 1 tablet (40 mg total) by mouth daily.   Blood Glucose Monitoring Suppl (CONTOUR BLOOD GLUCOSE SYSTEM) DEVI Test twice daily DX: E11.9   dorzolamide-timolol (COSOPT) 22.3-6.8 MG/ML ophthalmic solution 1 drop 2 (two) times daily.   FARXIGA  10 MG TABS tablet TAKE 1 TABLET BY MOUTH ONCE DAILY BEFORE BREAKFAST   glucose blood (ONETOUCH VERIO) test strip USE AS DIRECTED TWICE DAILY   lisinopril -hydrochlorothiazide  (ZESTORETIC ) 20-12.5 MG tablet Take 1 tablet by mouth daily.   metFORMIN  (GLUCOPHAGE ) 1000 MG tablet Take 1 tablet by mouth twice daily   Multiple Vitamins-Minerals (MULTIVITAMIN WITH MINERALS) tablet Take 1 tablet by mouth daily.     OneTouch Delica Lancets 33G MISC Use  as directed twice daily   albuterol  (PROVENTIL  HFA;VENTOLIN  HFA) 108 (90 Base) MCG/ACT inhaler Inhale 2 puffs into the lungs every 6 (six) hours as needed for wheezing or shortness of breath. (Patient not taking: Reported on 12/28/2023)   Cholecalciferol (VITAMIN D3 PO) Take 1,000 mg by mouth daily. (Patient not taking: Reported on 12/28/2023)   hyoscyamine  (LEVSIN SL) 0.125 MG SL tablet PLACE 1 TABLET UNDER THE TONGUE EVERY 4 HOURS AS NEEDED (Patient not taking: Reported on 12/28/2023)   No facility-administered encounter medications on file as of 12/28/2023.    Allergies (verified) Brimonidine   History: Past Medical History:  Diagnosis Date   Allergic rhinitis    Allergy    pollen    Asthma    with pollen   Cataract    Diabetes mellitus    Diverticulosis of colon    GERD (gastroesophageal reflux disease)    Glaucoma    Helicobacter pylori gastritis    HLD (hyperlipidemia)    Hx of adenomatous polyp of colon 08/06/2016   Hypertension    Obesity    Past Surgical History:  Procedure Laterality Date   BREAST BIOPSY Left 12/21/2023   US  LT BREAST BX W LOC DEV 1ST LESION IMG BX SPEC US  GUIDE 12/21/2023 [facility] M[facility]MO[facility]   CATARACT EXTRACTION Bilateral    COLONOSCOPY  04/08/2006; 07/2016   POLYPECTOMY     UPPER GASTROINTESTINAL ENDOSCOPY  06/16/2011   H. pylori gastritis with polyp  and hyperakeratosis of esophagus   VAGINAL HYSTERECTOMY  09/27/2006   and left paraovarian cystectomy   Family History  Problem Relation Age of Onset   Diabetes Mother    Heart disease Mother    Colon cancer Paternal Aunt    Colon cancer Paternal Uncle    Colon polyps Cousin    Rectal cancer Neg Hx    Stomach cancer Neg Hx    Esophageal cancer Neg Hx    Crohn's disease Neg Hx    Social History   Socioeconomic History   Marital status: Single    Spouse name: Not on file   Number of children: 2   Years of education: Not on file   Highest education level: Not on file  Occupational History    Occupation: Sport and exercise psychologist  Tobacco Use   Smoking status: Never    Passive exposure: Never   Smokeless tobacco: Never  Vaping Use   Vaping status: Never Used  Substance and Sexual Activity   Alcohol use: No   Drug use: No   Sexual activity: Not Currently  Other Topics Concern   Not on file  Social History Narrative   2 caffeine drinks daily    Social Drivers of Health   Financial Resource Strain: Low Risk  (12/28/2023)   Overall Financial Resource Strain (CARDIA)    Difficulty of Paying Living Expenses: Not hard at all  Food Insecurity: No Food Insecurity (12/28/2023)   Hunger Vital Sign    Worried About Running Out of Food in the Last Year: Never true    Ran Out of Food in the Last Year: Never true  Transportation Needs: No Transportation Needs (12/28/2023)   PRAPARE - Administrator, Civil Service (Medical): No    Lack of Transportation (Non-Medical): No  Physical Activity: Sufficiently Active (12/28/2023)   Exercise Vital Sign    Days of Exercise per Week: 3 days    Minutes of Exercise per Session: 50 min  Stress: No Stress Concern Present (12/28/2023)   [employer] of Occupational Health - Occupational Stress Questionnaire    Feeling of Stress : Not at all  Social Connections: Moderately Isolated (12/28/2023)   Social Connection and Isolation Panel [NHANES]    Frequency of Communication with Friends and Family: More than three times a week    Frequency of Social Gatherings with Friends and Family: Patient declined    Attends Religious Services: More than 4 times per year    Active Member of [employer] or Organizations: No    Attends Engineer, structural: Never    Marital Status: Divorced    Tobacco Counseling Counseling given: Not Answered    Clinical Intake:  Pre-visit preparation completed: Yes  Pain : No/denies pain     Nutritional Status: BMI > 30  Obese Nutritional Risks: None Diabetes: Yes CBG done?: No Did pt. bring in  CBG monitor from home?: No  Lab Results  Component Value Date   HGBA1C 7.6 (A) 03/16/2023   HGBA1C 8.3 (A) 03/13/2022   HGBA1C 7.3 (A) 11/24/2021     How often do you need to have someone help you when you read instructions, pamphlets, or other written materials from your doctor or pharmacy?: 4 - Often  Interpreter Needed?: Yes Interpreter Agency: Lenis Quin Interpreter Name: Lonie Roa  Information entered by :: NAllen LPN   Activities of Daily Living     12/28/2023    8:11 AM  In your present state of health, do you have any  difficulty performing the following activities:  Hearing? 0  Vision? 0  Difficulty concentrating or making decisions? 0  Walking or climbing stairs? 0  Dressing or bathing? 0  Doing errands, shopping? 0  Preparing Food and eating ? N  Using the Toilet? N  In the past six months, have you accidently leaked urine? N  Do you have problems with loss of bowel control? N  Managing your Medications? N  Managing your Finances? N  Housekeeping or managing your Housekeeping? N    Patient Care Team: Watson Hacking, MD as PCP - General (Family Medicine)  Indicate any recent Medical Services you may have received from other than [facility] providers in the past year (date may be approximate).     Assessment:   This is a routine wellness examination for [facility].  Hearing/Vision screen Hearing Screening - Comments:: Denies hearing issues Vision Screening - Comments:: Regular eye exams, [facility]   Goals Addressed             This Visit's Progress    Patient Stated       12/28/2023, denies goals       Depression Screen     12/28/2023    8:20 AM 12/22/2022    8:16 AM 03/13/2022    8:36 AM 02/27/2022    1:35 PM 11/24/2021    1:36 PM 07/21/2021    9:44 AM 06/07/2020   10:37 AM  PHQ 2/9 Scores  PHQ - 2 Score 1 0 0 0 0 0 0  PHQ- 9 Score 1 0  2       Fall Risk     12/28/2023    8:20 AM 12/22/2022    8:15 AM 02/27/2022    1:35 PM 07/21/2021    9:44 AM  06/07/2020   10:37 AM  Fall Risk   Falls in the past year? 0 0 0 0 0  Number falls in past yr: 0 0 0 0   Injury with Fall? 0 0 0 0   Risk for fall due to : Medication side effect Medication side effect Medication side effect No Fall Risks   Follow up Falls prevention discussed;Falls evaluation completed Falls prevention discussed;Education provided;Falls evaluation completed Falls evaluation completed;Education provided;Falls prevention discussed Falls evaluation completed     MEDICARE RISK AT HOME:  Medicare Risk at Home Any stairs in or around the home?: No If so, are there any without handrails?: No Home free of loose throw rugs in walkways, pet beds, electrical cords, etc?: Yes Adequate lighting in your home to reduce risk of falls?: Yes Life alert?: No Use of a cane, walker or w/c?: No Grab bars in the bathroom?: Yes Shower chair or bench in shower?: No Elevated toilet seat or a handicapped toilet?: No  TIMED UP AND GO:  Was the test performed?  Yes  Length of time to ambulate 10 feet: 5 sec Gait steady and fast without use of assistive device  Cognitive Function: Normal: Normal cognitive status assessed by direct observation by this Clinical Health Advisor. No abnormalities found. Patient is able to answer questions in an accurate and timely manner.        12/22/2022    8:18 AM  6CIT Screen  What Year? 0 points  What month? 0 points  What time? 0 points  Count back from 20 0 points  Months in reverse 0 points  Repeat phrase 6 points  Total Score 6 points    Immunizations Immunization History  Administered Date(s)  Administered   DTaP 02/10/2006   Fluad Quad(high Dose 65+) 05/23/2019, 06/07/2020, 06/20/2021   Influenza Split 08/03/2011   Influenza, High Dose Seasonal PF 10/11/2018, 07/03/2023   Influenza,inj,Quad PF,6+ Mos 07/04/2013, 06/05/2014, 06/29/2016, 07/15/2017   Moderna SARS-COV2 Booster Vaccination 08/08/2020   Moderna Sars-Covid-2 Vaccination  11/15/2019, 12/13/2019   PNEUMOCOCCAL CONJUGATE-20 03/16/2023   Pneumococcal Conjugate-13 08/12/2015   Pneumococcal Polysaccharide-23 07/04/2013   Tdap 06/29/2016   Zoster Recombinant(Shingrix) 08/03/2020, 01/19/2021   Zoster, Live 08/01/2014    Screening Tests Health Maintenance  Topic Date Due   OPHTHALMOLOGY EXAM  02/14/2022   HEMOGLOBIN A1C  09/16/2023   Diabetic kidney evaluation - eGFR measurement  03/15/2024   Diabetic kidney evaluation - Urine ACR  03/15/2024   FOOT EXAM  03/15/2024   INFLUENZA VACCINE  04/07/2024   Medicare Annual Wellness (AWV)  12/27/2024   MAMMOGRAM  12/02/2025   DTaP/Tdap/Td (3 - Td or Tdap) 06/29/2026   Colonoscopy  01/05/2029   DEXA SCAN  Completed   Hepatitis C Screening  Completed   Zoster Vaccines- Shingrix  Completed   HPV VACCINES  Aged Out   Meningococcal B Vaccine  Aged Out   Pneumonia Vaccine 46+ Years old  Discontinued   COVID-19 Vaccine  Discontinued    Health Maintenance  Health Maintenance Due  Topic Date Due   OPHTHALMOLOGY EXAM  02/14/2022   HEMOGLOBIN A1C  09/16/2023   Health Maintenance Items Addressed: States has eye appointment coming up. A1C will be addressed at July appointment.  Additional Screening:  Vision Screening: Recommended annual ophthalmology exams for early detection of glaucoma and other disorders of the eye.  Dental Screening: Recommended annual dental exams for proper oral hygiene  Community Resource Referral / Chronic Care Management: CRR required this visit?  No   CCM required this visit?  No     Plan:     I have personally reviewed and noted the following in the patient's chart:   Medical and social history Use of alcohol, tobacco or illicit drugs  Current medications and supplements including opioid prescriptions. Patient is not currently taking opioid prescriptions. Functional ability and status Nutritional status Physical activity Advanced directives List of other  physicians Hospitalizations, surgeries, and ER visits in previous 12 months Vitals Screenings to include cognitive, depression, and falls Referrals and appointments  In addition, I have reviewed and discussed with patient certain preventive protocols, quality metrics, and best practice recommendations. A written personalized care plan for preventive services as well as general preventive health recommendations were provided to patient.     Areatha Beecham, LPN   [id number]   After Visit Summary: (In Person-Printed) AVS printed and given to the patient  Notes: Nothing significant to report at this time.

## 2024-01-06 ENCOUNTER — Encounter: Payer: Self-pay | Admitting: Family Medicine

## 2024-01-06 ENCOUNTER — Ambulatory Visit: Admitting: Family Medicine

## 2024-01-06 VITALS — BP 118/70 | HR 78 | Wt 176.4 lb

## 2024-01-06 DIAGNOSIS — I152 Hypertension secondary to endocrine disorders: Secondary | ICD-10-CM | POA: Diagnosis not present

## 2024-01-06 DIAGNOSIS — E1169 Type 2 diabetes mellitus with other specified complication: Secondary | ICD-10-CM | POA: Diagnosis not present

## 2024-01-06 DIAGNOSIS — E118 Type 2 diabetes mellitus with unspecified complications: Secondary | ICD-10-CM | POA: Diagnosis not present

## 2024-01-06 DIAGNOSIS — E785 Hyperlipidemia, unspecified: Secondary | ICD-10-CM

## 2024-01-06 DIAGNOSIS — E1159 Type 2 diabetes mellitus with other circulatory complications: Secondary | ICD-10-CM

## 2024-01-06 DIAGNOSIS — N3281 Overactive bladder: Secondary | ICD-10-CM | POA: Diagnosis not present

## 2024-01-06 DIAGNOSIS — Z860101 Personal history of adenomatous and serrated colon polyps: Secondary | ICD-10-CM

## 2024-01-06 DIAGNOSIS — E1136 Type 2 diabetes mellitus with diabetic cataract: Secondary | ICD-10-CM

## 2024-01-06 DIAGNOSIS — Z0000 Encounter for general adult medical examination without abnormal findings: Secondary | ICD-10-CM

## 2024-01-06 DIAGNOSIS — H4010X0 Unspecified open-angle glaucoma, stage unspecified: Secondary | ICD-10-CM

## 2024-01-06 DIAGNOSIS — E6609 Other obesity due to excess calories: Secondary | ICD-10-CM

## 2024-01-06 DIAGNOSIS — Z6830 Body mass index (BMI) 30.0-30.9, adult: Secondary | ICD-10-CM

## 2024-01-06 DIAGNOSIS — K219 Gastro-esophageal reflux disease without esophagitis: Secondary | ICD-10-CM

## 2024-01-06 DIAGNOSIS — E66811 Obesity, class 1: Secondary | ICD-10-CM

## 2024-01-06 LAB — POCT GLYCOSYLATED HEMOGLOBIN (HGB A1C): Hemoglobin A1C: 5.8 % — AB (ref 4.0–5.6)

## 2024-01-06 LAB — LIPID PANEL

## 2024-01-06 MED ORDER — AMLODIPINE BESYLATE 5 MG PO TABS
5.0000 mg | ORAL_TABLET | Freq: Every day | ORAL | 3 refills | Status: AC
Start: 2024-01-06 — End: ?

## 2024-01-06 MED ORDER — ATORVASTATIN CALCIUM 40 MG PO TABS: 40.0000 mg | ORAL_TABLET | Freq: Every day | ORAL | 3 refills | Status: AC

## 2024-01-06 MED ORDER — LISINOPRIL-HYDROCHLOROTHIAZIDE 20-12.5 MG PO TABS: 1.0000 | ORAL_TABLET | Freq: Every day | ORAL | 3 refills | Status: AC

## 2024-01-06 MED ORDER — METFORMIN HCL 1000 MG PO TABS: 1000.0000 mg | ORAL_TABLET | Freq: Two times a day (BID) | ORAL | 1 refills | Status: DC

## 2024-01-06 MED ORDER — FARXIGA 10 MG PO TABS
10.0000 mg | ORAL_TABLET | Freq: Every day | ORAL | 1 refills | Status: DC
Start: 2024-01-06 — End: 2024-07-19

## 2024-01-06 NOTE — Progress Notes (Unsigned)
   Subjective:    Patient ID: [name], female    DOB: July 01, 1951, 73 y.o.   MRN: [id number]  HPI Initially did discuss vitamin D however review of the record indicated that she needed a complete exam.  She continues on metformin  and Farxiga  and is having no difficulty with that.  She is also taking amlodipine  and lisinopril /HCTZ without difficulty and continues on Lipitor.  She has had a colonoscopy is on a 7-year schedule for repeat on this.  She does follow-up regularly with her ophthalmologist.  She is not having any reflux related symptoms.  She is also not having any bladder related symptoms.   Review of Systems  All other systems reviewed and are negative.  Family and social history as well as health maintenance and immunizations was reviewed    Objective:    Physical Exam Alert and in no distress. Tympanic membranes and canals are normal. Pharyngeal area is normal. Neck is supple without adenopathy or thyromegaly. Cardiac exam shows a regular sinus rhythm without murmurs or gallops. Lungs are clear to auscultation. Hemoglobin A1c is 5.8       Assessment & Plan:   Routine general medical examination at a health care facility  Hypertension associated with diabetes (HCC) - Plan: POCT glycosylated hemoglobin (Hb A1C), CBC with Differential/Platelet, amLODipine  (NORVASC ) 5 MG tablet, lisinopril -hydrochlorothiazide  (ZESTORETIC ) 20-12.5 MG tablet  Hyperlipidemia associated with type 2 diabetes mellitus (HCC) - Plan: Comprehensive metabolic panel with GFR, atorvastatin  (LIPITOR) 40 MG tablet  Open-angle glaucoma of right eye, unspecified glaucoma stage, unspecified open-angle glaucoma type  Cataract associated with type 2 diabetes mellitus (HCC)  Gastroesophageal reflux disease without esophagitis  Class 1 obesity due to excess calories with serious comorbidity and body mass index (BMI) of 30.0 to 30.9 in adult  OAB (overactive bladder)  Controlled diabetes  mellitus type 2 with complications, unspecified whether long term insulin use (HCC) - Plan: POCT glycosylated hemoglobin (Hb A1C), CBC with Differential/Platelet, Comprehensive metabolic panel with GFR, Lipid panel, POCT UA - Microalbumin, FARXIGA  10 MG TABS tablet, metFORMIN  (GLUCOPHAGE ) 1000 MG tablet  Hx of adenomatous colonic polyps Overall she is doing quite well.  Encouraged her to continue with her present medication regimen as well as diet and exercise.  Follow-up here in 6 months.

## 2024-01-06 NOTE — Progress Notes (Unsigned)
 Complete physical exam  Patient: [name]   DOB: 1951-01-22   73 y.o. Female  MRN: [id number]  Subjective:    Chief Complaint  Patient presents with   Acute Visit    Wants to discuss high does in vitamin E. Discovered lump on breast during mammogram not cancer. Dr. Arnetta Lank she needs a high does in vitamin E.     [name] is a 73 y.o. female who presents today for a complete physical exam.  She reports consuming a general diet. The patient does not participate in regular exercise at present. She generally feels fairly well. She reports sleeping {DESC; WELL/FAIRLY WELL/POORLY:18703}.  Most recent fall risk assessment:    01/06/2024   11:03 AM  Fall Risk   Falls in the past year? 0  Number falls in past yr: 0  Injury with Fall? 0  Risk for fall due to : No Fall Risks  Follow up Falls evaluation completed     Most recent depression screenings:    01/06/2024   11:03 AM 12/28/2023    8:20 AM  PHQ 2/9 Scores  PHQ - 2 Score 0 1  PHQ- 9 Score 6 1    Vision:June 2024 and Dental: No current dental problems march 2025  {History (Optional):23778}  Immunization History  Administered Date(s) Administered   DTaP 02/10/2006   Fluad Quad(high Dose 65+) 05/23/2019, 06/07/2020, 06/20/2021   Influenza Split 08/03/2011   Influenza, High Dose Seasonal PF 10/11/2018, 07/03/2023   Influenza,inj,Quad PF,6+ Mos 07/04/2013, 06/05/2014, 06/29/2016, 07/15/2017   Moderna SARS-COV2 Booster Vaccination 08/08/2020   Moderna Sars-Covid-2 Vaccination 11/15/2019, 12/13/2019   PNEUMOCOCCAL CONJUGATE-20 03/16/2023   Pneumococcal Conjugate-13 08/12/2015   Pneumococcal Polysaccharide-23 07/04/2013   Tdap 06/29/2016   Zoster Recombinant(Shingrix) 08/03/2020, 01/19/2021   Zoster, Live 08/01/2014    Health Maintenance  Topic Date Due   OPHTHALMOLOGY EXAM  02/14/2022   HEMOGLOBIN A1C  09/16/2023   Diabetic kidney evaluation - eGFR measurement  03/15/2024   Diabetic kidney  evaluation - Urine ACR  03/15/2024   FOOT EXAM  03/15/2024   INFLUENZA VACCINE  04/07/2024   Medicare Annual Wellness (AWV)  12/27/2024   MAMMOGRAM  12/02/2025   DTaP/Tdap/Td (3 - Td or Tdap) 06/29/2026   Colonoscopy  01/05/2029   DEXA SCAN  Completed   Hepatitis C Screening  Completed   Zoster Vaccines- Shingrix  Completed   HPV VACCINES  Aged Out   Meningococcal B Vaccine  Aged Out   Pneumonia Vaccine 36+ Years old  Discontinued   COVID-19 Vaccine  Discontinued    Patient Care Team: Watson Hacking, MD as PCP - General (Family Medicine)   Outpatient Medications Prior to Visit  Medication Sig   amLODipine  (NORVASC ) 5 MG tablet Take 1 tablet (5 mg total) by mouth daily.   atorvastatin  (LIPITOR) 40 MG tablet Take 1 tablet (40 mg total) by mouth daily.   Blood Glucose Monitoring Suppl (CONTOUR BLOOD GLUCOSE SYSTEM) DEVI Test twice daily DX: E11.9   dorzolamide-timolol (COSOPT) 22.3-6.8 MG/ML ophthalmic solution 1 drop 2 (two) times daily.   FARXIGA  10 MG TABS tablet TAKE 1 TABLET BY MOUTH ONCE DAILY BEFORE BREAKFAST   glucose blood (ONETOUCH VERIO) test strip USE AS DIRECTED TWICE DAILY   lisinopril -hydrochlorothiazide  (ZESTORETIC ) 20-12.5 MG tablet Take 1 tablet by mouth daily.   metFORMIN  (GLUCOPHAGE ) 1000 MG tablet Take 1 tablet by mouth twice daily   Multiple Vitamins-Minerals (MULTIVITAMIN WITH MINERALS) tablet Take 1 tablet by mouth daily.  OneTouch Delica Lancets 33G MISC Use as directed twice daily   albuterol  (PROVENTIL  HFA;VENTOLIN  HFA) 108 (90 Base) MCG/ACT inhaler Inhale 2 puffs into the lungs every 6 (six) hours as needed for wheezing or shortness of breath. (Patient not taking: Reported on 03/07/2018)   Cholecalciferol (VITAMIN D3 PO) Take 1,000 mg by mouth daily. (Patient not taking: Reported on 01/06/2024)   hyoscyamine  (LEVSIN SL) 0.125 MG SL tablet PLACE 1 TABLET UNDER THE TONGUE EVERY 4 HOURS AS NEEDED (Patient not taking: Reported on 01/06/2024)   No  facility-administered medications prior to visit.    ROS  Family and social history as well as health maintenance and immunizations was reviewed.     Objective:    BP 118/70   Pulse 78   Wt 176 lb 6.4 oz (80 kg)   SpO2 98%   BMI 29.81 kg/m  {Vitals History (Optional):23777}  Physical Exam   No results found for any visits on 01/06/24. {Show previous labs (optional):23779}    Assessment & Plan:    Discussed health benefits of physical activity, and encouraged her to engage in regular exercise appropriate for her age and condition.  Hypertension associated with diabetes (HCC)  Hyperlipidemia associated with type 2 diabetes mellitus (HCC)  Open-angle glaucoma of right eye, unspecified glaucoma stage, unspecified open-angle glaucoma type  Cataract associated with type 2 diabetes mellitus (HCC)  Gastroesophageal reflux disease without esophagitis  Class 1 obesity due to excess calories with serious comorbidity and body mass index (BMI) of 30.0 to 30.9 in adult  OAB (overactive bladder)  Routine general medical examination at a health care facility   No follow-ups on file.      Eddye Goodie, CMA

## 2024-01-06 NOTE — Patient Instructions (Signed)
 Get a good brand name multivitamin something like Theragran-M or Centrum

## 2024-01-07 LAB — CBC WITH DIFFERENTIAL/PLATELET
Basophils Absolute: 0.1 10*3/uL (ref 0.0–0.2)
Basos: 1 %
EOS (ABSOLUTE): 0.4 10*3/uL (ref 0.0–0.4)
Eos: 8 %
Hematocrit: 36.3 % (ref 34.0–46.6)
Hemoglobin: 12.2 g/dL (ref 11.1–15.9)
Immature Grans (Abs): 0 10*3/uL (ref 0.0–0.1)
Immature Granulocytes: 0 %
Lymphocytes Absolute: 2.3 10*3/uL (ref 0.7–3.1)
Lymphs: 45 %
MCH: 29.4 pg (ref 26.6–33.0)
MCHC: 33.6 g/dL (ref 31.5–35.7)
MCV: 88 fL (ref 79–97)
Monocytes Absolute: 0.5 10*3/uL (ref 0.1–0.9)
Monocytes: 10 %
Neutrophils Absolute: 1.8 10*3/uL (ref 1.4–7.0)
Neutrophils: 36 %
Platelets: 293 10*3/uL (ref 150–450)
RBC: 4.15 x10E6/uL (ref 3.77–5.28)
RDW: 14.5 % (ref 11.7–15.4)
WBC: 5.1 10*3/uL (ref 3.4–10.8)

## 2024-01-07 LAB — COMPREHENSIVE METABOLIC PANEL WITH GFR
ALT: 17 IU/L (ref 0–32)
AST: 15 IU/L (ref 0–40)
Albumin: 4.3 g/dL (ref 3.9–4.9)
Alkaline Phosphatase: 78 IU/L (ref 44–121)
BUN/Creatinine Ratio: 27 (ref 12–28)
BUN: 18 mg/dL (ref 8–27)
Bilirubin Total: 0.4 mg/dL (ref 0.0–1.2)
CO2: 23 mmol/L (ref 20–29)
Calcium: 9.8 mg/dL (ref 8.7–10.3)
Chloride: 102 mmol/L (ref 96–106)
Creatinine, Ser: 0.67 mg/dL (ref 0.57–1.00)
Globulin, Total: 2.8 g/dL (ref 1.5–4.5)
Glucose: 80 mg/dL (ref 70–99)
Potassium: 4.5 mmol/L (ref 3.5–5.2)
Sodium: 140 mmol/L (ref 134–144)
Total Protein: 7.1 g/dL (ref 6.0–8.5)
eGFR: 94 mL/min/{1.73_m2} (ref >59)

## 2024-01-07 LAB — LIPID PANEL
Chol/HDL Ratio: 2.9 ratio (ref 0.0–4.4)
Cholesterol, Total: 187 mg/dL (ref 100–199)
HDL: 65 mg/dL (ref >39)
LDL Chol Calc (NIH): 101 mg/dL — ABNORMAL HIGH (ref 0–99)
Triglycerides: 122 mg/dL (ref 0–149)
VLDL Cholesterol Cal: 21 mg/dL (ref 5–40)

## 2024-03-17 DIAGNOSIS — Z961 Presence of intraocular lens: Secondary | ICD-10-CM | POA: Diagnosis not present

## 2024-03-17 DIAGNOSIS — E119 Type 2 diabetes mellitus without complications: Secondary | ICD-10-CM | POA: Diagnosis not present

## 2024-03-17 DIAGNOSIS — H52223 Regular astigmatism, bilateral: Secondary | ICD-10-CM | POA: Diagnosis not present

## 2024-03-17 DIAGNOSIS — H524 Presbyopia: Secondary | ICD-10-CM | POA: Diagnosis not present

## 2024-03-17 DIAGNOSIS — H5203 Hypermetropia, bilateral: Secondary | ICD-10-CM | POA: Diagnosis not present

## 2024-03-17 DIAGNOSIS — H401131 Primary open-angle glaucoma, bilateral, mild stage: Secondary | ICD-10-CM | POA: Diagnosis not present

## 2024-03-22 ENCOUNTER — Ambulatory Visit: Payer: PPO | Admitting: Family Medicine

## 2024-05-02 ENCOUNTER — Ambulatory Visit (INDEPENDENT_AMBULATORY_CARE_PROVIDER_SITE_OTHER): Admitting: Family Medicine

## 2024-05-02 ENCOUNTER — Encounter: Payer: Self-pay | Admitting: Family Medicine

## 2024-05-02 VITALS — BP 130/80 | HR 73 | Ht 66.0 in | Wt 180.6 lb

## 2024-05-02 DIAGNOSIS — R1310 Dysphagia, unspecified: Secondary | ICD-10-CM | POA: Diagnosis not present

## 2024-05-02 DIAGNOSIS — M545 Low back pain, unspecified: Secondary | ICD-10-CM

## 2024-05-02 DIAGNOSIS — M546 Pain in thoracic spine: Secondary | ICD-10-CM | POA: Diagnosis not present

## 2024-05-02 DIAGNOSIS — Z8719 Personal history of other diseases of the digestive system: Secondary | ICD-10-CM | POA: Diagnosis not present

## 2024-05-02 NOTE — Patient Instructions (Signed)
 Heat for 20 minutes several times a day for your back with gentle stretching after that and use 2 Tylenol  4 times per day for your back I will call Dr. Darilyn office and set up an appointment for you

## 2024-05-02 NOTE — Progress Notes (Signed)
   Subjective:    Patient ID: [name], female    DOB: [birth year], 73 y.o.   MRN: [id number]  Discussed the use of AI scribe software for clinical note transcription with the patient, who gave verbal consent to proceed.  History of Present Illness   [name] is a 73 year old female who presents with upper and lower back discomfort and swallowing difficulties.  She has been experiencing a heavy feeling in her upper and lower back since Friday night. The sensation is described as a fullness rather than pain, and it improves when she leans backward or applies heat using an electric massage pillow for about half an hour. She has not taken any medications like Tylenol  or Advil for this issue. Exercise also seems to alleviate the discomfort, and movement does not exacerbate it. No pain is associated with the back discomfort, and there is no worsening of symptoms with movement.  She has a long-standing history of stomach issues, reportedly lasting seven to eight years, characterized by gas and difficulty eating at times. Drinking water and hot tea provide some relief. She has not used any medications like Maalox, Mylanta, or Protonix  for these symptoms.  She describes a sensation of food getting stuck when swallowing and reports having undergone a procedure two years ago that improved her symptoms at the time. Two years ago, she underwent a procedure to address this issue, which improved her symptoms at the time. However, she feels that the swallowing difficulty is worsening again. She has not contacted her gastroenterologist recently regarding this issue.           Review of Systems     Objective:    Physical Exam Physical Exam   Alert and in no distress. Tympanic membranes and canals are normal. Pharyngeal area is normal. Neck is supple without adenopathy or thyromegaly. Cardiac exam shows a regular sinus rhythm without murmurs or gallops. Lungs are clear to  auscultation.       Assessment & Plan:  Assessment and Plan    Esophageal stricture with dysphagia Reports sensation of food getting stuck, suggesting recurrence of esophageal stricture. Previously treated by Dr. Avram with symptom improvement. - Refer to Dr. Avram for evaluation.  Chronic gastrointestinal symptoms (gas and bloating) Long-standing gas and bloating relieved by water and hot tea. No use of medications like Maalox, Mylanta, or Protonix .  Chronic upper and lower back discomfort Heavy feeling in back since last Friday, relieved by heat, massage, and exercise. No medications used. - Continue stretching exercises. - Take two Tylenol  four times daily for discomfort.     Follow-up.  Continued difficulty.

## 2024-06-28 NOTE — Progress Notes (Signed)
 [name]                                          MRN: [id number]   06/28/2024   The [facility] Quality Team Specialist reviewed this patient medical record for the purposes of chart review for care gap closure. The following were reviewed: chart review for care gap closure-kidney health evaluation for diabetes:eGFR  and uACR.    [facility] Quality Team

## 2024-07-11 ENCOUNTER — Encounter: Payer: Self-pay | Admitting: Family Medicine

## 2024-07-11 ENCOUNTER — Ambulatory Visit: Admitting: Family Medicine

## 2024-07-11 VITALS — BP 128/78 | HR 68 | Wt 181.8 lb

## 2024-07-11 DIAGNOSIS — Z7984 Long term (current) use of oral hypoglycemic drugs: Secondary | ICD-10-CM

## 2024-07-11 DIAGNOSIS — E1165 Type 2 diabetes mellitus with hyperglycemia: Secondary | ICD-10-CM

## 2024-07-11 LAB — POCT GLYCOSYLATED HEMOGLOBIN (HGB A1C): Hemoglobin A1C: 7.3 % — AB (ref 4.0–5.6)

## 2024-07-11 MED ORDER — TIRZEPATIDE 2.5 MG/0.5ML ~~LOC~~ SOAJ: 2.5000 mg | SUBCUTANEOUS | 0 refills | Status: AC

## 2024-07-11 MED ORDER — METFORMIN HCL 1000 MG PO TABS: 1000.0000 mg | ORAL_TABLET | Freq: Two times a day (BID) | ORAL | 1 refills | Status: AC

## 2024-07-11 NOTE — Progress Notes (Signed)
 Name: [name]   Date of Visit: 07/11/24   Date of last visit with me: Visit date not found   CHIEF COMPLAINT:  Chief Complaint  Patient presents with   Follow-up    6 month follow up.        HPI:  Discussed the use of AI scribe software for clinical note transcription with the patient, who gave verbal consent to proceed.  History of Present Illness [name] is a 73 year old female with diabetes who presents for blood sugar level check.  She has no current symptoms related to her diabetes and has been taking metformin  regularly. She does not require a refill as she has a three-month supply remaining.  Her A1c levels have fluctuated over time, previously recorded at 7.6, then decreased to 5.8, and have now increased to 7.3.  She inquired about the results of a mammogram performed around March, which showed a lesion attributed to hormonal changes, but no further issues were noted.     OBJECTIVE:       01/06/2024   11:03 AM  Depression screen PHQ 2/9  Decreased Interest 0  Down, Depressed, Hopeless 0  PHQ - 2 Score 0  Altered sleeping 0  Tired, decreased energy 1  Change in appetite 2  Feeling bad or failure about yourself  0  Trouble concentrating 3  Moving slowly or fidgety/restless 0  Suicidal thoughts 0  PHQ-9 Score 6     BP Readings from Last 3 Encounters:  07/11/24 128/78  05/02/24 130/80  01/06/24 118/70    BP 128/78   Pulse 68   Wt 181 lb 12.8 oz (82.5 kg)   SpO2 98%   BMI 29.34 kg/m    Physical Exam    Physical Exam Constitutional:      Appearance: Normal appearance.  Neurological:     General: No focal deficit present.     Mental Status: She is alert and oriented to person, place, and time. Mental status is at baseline.     ASSESSMENT/PLAN:   Assessment & Plan Uncontrolled type 2 diabetes mellitus with hyperglycemia (HCC)    Assessment and Plan Assessment & Plan Type 2 diabetes mellitus with  hyperglycemia A1c increased from 5.8 to 7.3, indicating worsening glycemic control. Current management with metformin  is insufficient. Discussed potential use of weight loss medication to aid in glycemic control by addressing underlying obesity. Insulin therapy was discussed but declined. - Initiated weight loss medication with weekly injections to decrease hunger and aid in weight loss. - Scheduled follow-up in three weeks to assess tolerance and effectiveness of the medication. - Educated on the importance of not eating when not hungry and drinking plenty of water. - Patient with a diagnosis of type 2 diabetes and obesity. Despite lifestyle modification, dietary changes, and regular exercise, weight loss and glycemic control remain suboptimal. Initiation of a GLP-1 receptor agonist is medically indicated for improvement of glycemic control, weight reduction, and cardiometabolic risk reduction in accordance with current ADA and AACE guidelines. Patient has trialed first-line therapy with metformin  and continues to have an A1C above target. Given the presence of obesity and associated cardiovascular/metabolic risk, a GLP-1 receptor agonist is clinically appropriate. Medication choice discussed with the patient, including benefits, potential adverse effects, and administration instructions. Plan to initiate therapy pending insurance approval.  Obesity Contributing to poor glycemic control. Discussed the role of weight loss in improving diabetes management. Previous adverse effects with a different medication noted, but current proposal involves  a different medication with a different mechanism. - Initiated weight loss medication with weekly injections to aid in weight reduction. - Educated on the mechanism of the medication and its expected effects over three months.     Ajay Strubel A. Vita MD [facility]

## 2024-07-19 ENCOUNTER — Other Ambulatory Visit: Payer: Self-pay | Admitting: Family Medicine

## 2024-07-19 DIAGNOSIS — E118 Type 2 diabetes mellitus with unspecified complications: Secondary | ICD-10-CM

## 2024-08-10 ENCOUNTER — Telehealth: Payer: Self-pay

## 2024-08-10 ENCOUNTER — Encounter: Payer: Self-pay | Admitting: Family Medicine

## 2024-08-10 ENCOUNTER — Other Ambulatory Visit: Payer: Self-pay

## 2024-08-10 ENCOUNTER — Ambulatory Visit: Admitting: Family Medicine

## 2024-08-10 VITALS — BP 130/82 | HR 77 | Wt 179.6 lb

## 2024-08-10 DIAGNOSIS — E1165 Type 2 diabetes mellitus with hyperglycemia: Secondary | ICD-10-CM

## 2024-08-10 DIAGNOSIS — E119 Type 2 diabetes mellitus without complications: Secondary | ICD-10-CM | POA: Diagnosis not present

## 2024-08-10 DIAGNOSIS — Z7985 Long-term (current) use of injectable non-insulin antidiabetic drugs: Secondary | ICD-10-CM | POA: Diagnosis not present

## 2024-08-10 MED ORDER — TIRZEPATIDE 5 MG/0.5ML ~~LOC~~ SOAJ: 5.0000 mg | SUBCUTANEOUS | 0 refills | Status: DC

## 2024-08-10 MED ORDER — TIRZEPATIDE 5 MG/0.5ML ~~LOC~~ SOAJ: 5.0000 mg | SUBCUTANEOUS | 0 refills | Status: AC

## 2024-08-10 NOTE — Progress Notes (Signed)
   Name: [name]   Date of Visit: 08/10/24   Date of last visit with me: 07/11/2024   CHIEF COMPLAINT:  Chief Complaint  Patient presents with   Follow-up    4 week follow up diabetes.        HPI:  Discussed the use of AI scribe software for clinical note transcription with the patient, who gave verbal consent to proceed.  History of Present Illness   [name] is a 73 year old female who presents for a follow-up regarding her weight management and medication adjustment.  She is currently on a medication regimen that includes a weekly injection, which she tolerates well without any issues such as nausea or vomiting. Her current dose is 2.5 mg.  She has experienced a weight loss of approximately two pounds, which she attributes to wearing shoes during her weigh-in.  She is planning to travel to the [place] next week and will be away until March. She is concerned about her medication supply during this period, as she can only take a four-week supply at a time. She is exploring the possibility of obtaining a 90-day supply. During her trip, she plans to maintain her physical activity through walking and is mindful of her dietary intake, stating 'I don't eat much.'  No nausea or vomiting.         OBJECTIVE:       01/06/2024   11:03 AM  Depression screen PHQ 2/9  Decreased Interest 0  Down, Depressed, Hopeless 0  PHQ - 2 Score 0  Altered sleeping 0  Tired, decreased energy 1  Change in appetite 2  Feeling bad or failure about yourself  0  Trouble concentrating 3  Moving slowly or fidgety/restless 0  Suicidal thoughts 0  PHQ-9 Score 6      Data saved with a previous flowsheet row definition     BP Readings from Last 3 Encounters:  08/10/24 130/82  07/11/24 128/78  05/02/24 130/80    BP 130/82   Pulse 77   Wt 179 lb 9.6 oz (81.5 kg)   SpO2 99%   BMI 28.99 kg/m    Physical Exam          Physical Exam Constitutional:       Appearance: Normal appearance.  Neurological:     General: No focal deficit present.     Mental Status: She is alert and oriented to person, place, and time.     ASSESSMENT/PLAN:   Assessment & Plan Uncontrolled type 2 diabetes mellitus with hyperglycemia (HCC)  Diabetes mellitus treated with injections of non-insulin medication (HCC)    Assessment and Plan    Type 2 diabetes mellitus with hyperglycemia, chronic without significant weight loss.  Uncontrolled on medication with minimal weight loss. Plan to titrate medication dose. - Increased medication dose to 5 mg weekly. - Maintain 5 mg dose for three months due to vacation. - Check A1c upon return to assess blood sugar control. - Sent 90-day prescription to pharmacy, option for 30-day if cost is prohibitive.         Mareo Portilla A. Vita MD [facility]

## 2024-08-10 NOTE — Telephone Encounter (Signed)
 [facility] is wanting Clarification as to which mg Mounjaro  you are wanting filled. Looks like 2.5 mg and 5 mg were both filled.

## 2024-08-13 ENCOUNTER — Other Ambulatory Visit: Payer: Self-pay | Admitting: Family Medicine

## 2024-11-27 ENCOUNTER — Ambulatory Visit: Admitting: Family Medicine

## 2025-01-02 ENCOUNTER — Ambulatory Visit: Payer: Self-pay

## 2025-01-09 ENCOUNTER — Ambulatory Visit
